# Patient Record
Sex: Female | Born: 1950 | Race: White | Hispanic: No | Smoking: Former smoker
Health system: Southern US, Community
[De-identification: ages and names within clinical notes are randomized; demographics above are authoritative.]

## PROBLEM LIST (undated history)

## (undated) DIAGNOSIS — F419 Anxiety disorder, unspecified: Secondary | ICD-10-CM

## (undated) DIAGNOSIS — E119 Type 2 diabetes mellitus without complications: Secondary | ICD-10-CM

## (undated) DIAGNOSIS — E785 Hyperlipidemia, unspecified: Secondary | ICD-10-CM

## (undated) DIAGNOSIS — F32A Depression, unspecified: Secondary | ICD-10-CM

## (undated) DIAGNOSIS — I2510 Atherosclerotic heart disease of native coronary artery without angina pectoris: Secondary | ICD-10-CM

## (undated) DIAGNOSIS — J449 Chronic obstructive pulmonary disease, unspecified: Secondary | ICD-10-CM

## (undated) DIAGNOSIS — I714 Abdominal aortic aneurysm, without rupture, unspecified: Secondary | ICD-10-CM

## (undated) DIAGNOSIS — I739 Peripheral vascular disease, unspecified: Secondary | ICD-10-CM

## (undated) DIAGNOSIS — I5022 Chronic systolic (congestive) heart failure: Secondary | ICD-10-CM

## (undated) DIAGNOSIS — Z972 Presence of dental prosthetic device (complete) (partial): Secondary | ICD-10-CM

## (undated) DIAGNOSIS — I480 Paroxysmal atrial fibrillation: Secondary | ICD-10-CM

## (undated) HISTORY — PX: CORONARY ARTERY BYPASS GRAFT: SHX141

## (undated) HISTORY — PX: CORONARY ANGIOPLASTY WITH STENT PLACEMENT: SHX49

## (undated) HISTORY — PX: CHOLECYSTECTOMY: SHX55

---

## 2003-07-30 HISTORY — PX: CORONARY ARTERY BYPASS GRAFT: SHX141

## 2004-05-30 ENCOUNTER — Ambulatory Visit: Payer: Self-pay | Admitting: Occupational Therapy

## 2005-04-16 ENCOUNTER — Ambulatory Visit (HOSPITAL_COMMUNITY): Admission: RE | Admit: 2005-04-16 | Discharge: 2005-04-16 | Payer: Self-pay | Admitting: Occupational Therapy

## 2009-10-02 ENCOUNTER — Emergency Department (HOSPITAL_COMMUNITY): Admission: EM | Admit: 2009-10-02 | Discharge: 2009-10-03 | Payer: Self-pay | Admitting: Emergency Medicine

## 2010-10-22 LAB — URINALYSIS, ROUTINE W REFLEX MICROSCOPIC
Bilirubin Urine: NEGATIVE
Nitrite: NEGATIVE
Protein, ur: NEGATIVE mg/dL
Specific Gravity, Urine: 1.015 (ref 1.005–1.030)
pH: 5.5 (ref 5.0–8.0)

## 2010-10-22 LAB — COMPREHENSIVE METABOLIC PANEL
ALT: 43 U/L — ABNORMAL HIGH (ref 0–35)
AST: 24 U/L (ref 0–37)
Albumin: 3.9 g/dL (ref 3.5–5.2)
Alkaline Phosphatase: 69 U/L (ref 39–117)
BUN: 22 mg/dL (ref 6–23)
Calcium: 9.7 mg/dL (ref 8.4–10.5)
Creatinine, Ser: 0.94 mg/dL (ref 0.4–1.2)
GFR calc non Af Amer: >60 mL/min (ref >60)
Glucose, Bld: 500 mg/dL — ABNORMAL HIGH (ref 70–99)
Total Protein: 6.6 g/dL (ref 6.0–8.3)

## 2010-10-22 LAB — POCT CARDIAC MARKERS
CKMB, poc: 3.4 ng/mL (ref 1.0–8.0)
Myoglobin, poc: 202 ng/mL (ref 12–200)
Troponin i, poc: <0.05 ng/mL (ref 0.00–0.09)

## 2010-10-22 LAB — CBC
HCT: 41.8 % (ref 36.0–46.0)
Hemoglobin: 14.5 g/dL (ref 12.0–15.0)
Platelets: 206 10*3/uL (ref 150–400)
RBC: 4.43 MIL/uL (ref 3.87–5.11)

## 2010-10-22 LAB — DIFFERENTIAL
Monocytes Absolute: 0.9 10*3/uL (ref 0.1–1.0)
Monocytes Relative: 4 % (ref 3–12)
Neutrophils Relative %: 91 % — ABNORMAL HIGH (ref 43–77)

## 2010-10-22 LAB — URINE MICROSCOPIC-ADD ON

## 2013-10-23 ENCOUNTER — Emergency Department (HOSPITAL_COMMUNITY): Payer: BC Managed Care – PPO

## 2013-10-23 ENCOUNTER — Encounter (HOSPITAL_COMMUNITY): Payer: Self-pay | Admitting: Emergency Medicine

## 2013-10-23 ENCOUNTER — Emergency Department (HOSPITAL_COMMUNITY)
Admission: EM | Admit: 2013-10-23 | Discharge: 2013-10-23 | Disposition: A | Discharge status: Home / Self Care | Payer: BC Managed Care – PPO | Attending: Emergency Medicine | Admitting: Emergency Medicine

## 2013-10-23 DIAGNOSIS — F17200 Nicotine dependence, unspecified, uncomplicated: Secondary | ICD-10-CM | POA: Insufficient documentation

## 2013-10-23 DIAGNOSIS — Z7982 Long term (current) use of aspirin: Secondary | ICD-10-CM | POA: Insufficient documentation

## 2013-10-23 DIAGNOSIS — R1013 Epigastric pain: Secondary | ICD-10-CM | POA: Insufficient documentation

## 2013-10-23 DIAGNOSIS — E860 Dehydration: Secondary | ICD-10-CM | POA: Insufficient documentation

## 2013-10-23 DIAGNOSIS — R112 Nausea with vomiting, unspecified: Secondary | ICD-10-CM | POA: Insufficient documentation

## 2013-10-23 DIAGNOSIS — I2510 Atherosclerotic heart disease of native coronary artery without angina pectoris: Secondary | ICD-10-CM | POA: Insufficient documentation

## 2013-10-23 DIAGNOSIS — Z951 Presence of aortocoronary bypass graft: Secondary | ICD-10-CM | POA: Insufficient documentation

## 2013-10-23 DIAGNOSIS — Z79899 Other long term (current) drug therapy: Secondary | ICD-10-CM | POA: Insufficient documentation

## 2013-10-23 DIAGNOSIS — E119 Type 2 diabetes mellitus without complications: Secondary | ICD-10-CM | POA: Insufficient documentation

## 2013-10-23 DIAGNOSIS — R197 Diarrhea, unspecified: Secondary | ICD-10-CM | POA: Insufficient documentation

## 2013-10-23 HISTORY — DX: Type 2 diabetes mellitus without complications: E11.9

## 2013-10-23 HISTORY — DX: Atherosclerotic heart disease of native coronary artery without angina pectoris: I25.10

## 2013-10-23 LAB — URINALYSIS, ROUTINE W REFLEX MICROSCOPIC
BILIRUBIN URINE: NEGATIVE
GLUCOSE, UA: NEGATIVE mg/dL
Hgb urine dipstick: NEGATIVE
KETONES UR: NEGATIVE mg/dL
Leukocytes, UA: NEGATIVE
NITRITE: NEGATIVE
PH: 6 (ref 5.0–8.0)
Protein, ur: NEGATIVE mg/dL
UROBILINOGEN UA: 0.2 mg/dL (ref 0.0–1.0)

## 2013-10-23 LAB — CBC WITH DIFFERENTIAL/PLATELET
Basophils Absolute: 0 10*3/uL (ref 0.0–0.1)
Basophils Relative: 0 % (ref 0–1)
EOS ABS: 0 10*3/uL (ref 0.0–0.7)
EOS PCT: 0 % (ref 0–5)
HCT: 44.1 % (ref 36.0–46.0)
HEMOGLOBIN: 14.9 g/dL (ref 12.0–15.0)
LYMPHS ABS: 0.8 10*3/uL (ref 0.7–4.0)
LYMPHS PCT: 14 % (ref 12–46)
MCH: 30.3 pg (ref 26.0–34.0)
MCHC: 33.8 g/dL (ref 30.0–36.0)
MCV: 89.8 fL (ref 78.0–100.0)
MONO ABS: 0.6 10*3/uL (ref 0.1–1.0)
MONOS PCT: 10 % (ref 3–12)
Neutro Abs: 4.3 10*3/uL (ref 1.7–7.7)
Neutrophils Relative %: 76 % (ref 43–77)
PLATELETS: 152 10*3/uL (ref 150–400)
RBC: 4.91 MIL/uL (ref 3.87–5.11)
RDW: 13.2 % (ref 11.5–15.5)
WBC: 5.7 10*3/uL (ref 4.0–10.5)

## 2013-10-23 LAB — COMPREHENSIVE METABOLIC PANEL
ALK PHOS: 40 U/L (ref 39–117)
ALT: 37 U/L — ABNORMAL HIGH (ref 0–35)
AST: 29 U/L (ref 0–37)
Albumin: 3.6 g/dL (ref 3.5–5.2)
BILIRUBIN TOTAL: 0.6 mg/dL (ref 0.3–1.2)
BUN: 12 mg/dL (ref 6–23)
CHLORIDE: 101 meq/L (ref 96–112)
CO2: 24 mEq/L (ref 19–32)
Calcium: 8.7 mg/dL (ref 8.4–10.5)
Creatinine, Ser: 0.57 mg/dL (ref 0.50–1.10)
GFR calc Af Amer: >90 mL/min (ref >90)
Glucose, Bld: 227 mg/dL — ABNORMAL HIGH (ref 70–99)
Potassium: 3.6 mEq/L — ABNORMAL LOW (ref 3.7–5.3)
SODIUM: 139 meq/L (ref 137–147)
Total Protein: 7.1 g/dL (ref 6.0–8.3)

## 2013-10-23 LAB — TROPONIN I: Troponin I: <0.3 ng/mL (ref <0.30)

## 2013-10-23 LAB — CBG MONITORING, ED: GLUCOSE-CAPILLARY: 204 mg/dL — AB (ref 70–99)

## 2013-10-23 MED ORDER — SODIUM CHLORIDE 0.9 % IV SOLN
1000.0000 mL | Freq: Once | INTRAVENOUS | Status: AC
  Administered 2013-10-23: 1000 mL via INTRAVENOUS

## 2013-10-23 MED ORDER — DIPHENHYDRAMINE HCL 50 MG/ML IJ SOLN
25.0000 mg | Freq: Once | INTRAMUSCULAR | Status: AC
  Administered 2013-10-23: 25 mg via INTRAVENOUS
  Filled 2013-10-23: qty 1

## 2013-10-23 MED ORDER — ONDANSETRON 4 MG PO TBDP: 4.0000 mg | ORAL_TABLET | Freq: Three times a day (TID) | ORAL | Status: DC | PRN

## 2013-10-23 MED ORDER — ONDANSETRON HCL 4 MG/2ML IJ SOLN
4.0000 mg | Freq: Once | INTRAMUSCULAR | Status: AC
  Administered 2013-10-23: 4 mg via INTRAVENOUS
  Filled 2013-10-23: qty 2

## 2013-10-23 MED ORDER — SODIUM CHLORIDE 0.9 % IV SOLN
1000.0000 mL | INTRAVENOUS | Status: DC
  Administered 2013-10-23: 1000 mL via INTRAVENOUS

## 2013-10-23 MED ORDER — METOCLOPRAMIDE HCL 5 MG/ML IJ SOLN
10.0000 mg | Freq: Once | INTRAMUSCULAR | Status: AC
  Administered 2013-10-23: 10 mg via INTRAVENOUS
  Filled 2013-10-23: qty 2

## 2013-10-23 NOTE — ED Notes (Signed)
Pt c/o n/v/d, generalized body aches, chills that started during the night,

## 2013-10-23 NOTE — Discharge Instructions (Signed)
Drink plenty of fluids. Take the zofran for nausea or vomiting. Take imodium OTC for diarrhea. Avoid milk until the diarrhea is gone. Recheck if you get worse again.  Nausea and Vomiting Nausea is a sick feeling that often comes before throwing up (vomiting). Vomiting is a reflex where stomach contents come out of your mouth. Vomiting can cause severe loss of body fluids (dehydration). Children and elderly adults can become dehydrated quickly, especially if they also have diarrhea. Nausea and vomiting are symptoms of a condition or disease. It is important to find the cause of your symptoms. CAUSES   Direct irritation of the stomach lining. This irritation can result from increased acid production (gastroesophageal reflux disease), infection, food poisoning, taking certain medicines (such as nonsteroidal anti-inflammatory drugs), alcohol use, or tobacco use.  Signals from the brain.These signals could be caused by a headache, heat exposure, an inner ear disturbance, increased pressure in the brain from injury, infection, a tumor, or a concussion, pain, emotional stimulus, or metabolic problems.  An obstruction in the gastrointestinal tract (bowel obstruction).  Illnesses such as diabetes, hepatitis, gallbladder problems, appendicitis, kidney problems, cancer, sepsis, atypical symptoms of a heart attack, or eating disorders.  Medical treatments such as chemotherapy and radiation.  Receiving medicine that makes you sleep (general anesthetic) during surgery. DIAGNOSIS Your caregiver may ask for tests to be done if the problems do not improve after a few days. Tests may also be done if symptoms are severe or if the reason for the nausea and vomiting is not clear. Tests may include:  Urine tests.  Blood tests.  Stool tests.  Cultures (to look for evidence of infection).  X-rays or other imaging studies. Test results can help your caregiver make decisions about treatment or the need for  additional tests. TREATMENT You need to stay well hydrated. Drink frequently but in small amounts.You may wish to drink water, sports drinks, clear broth, or eat frozen ice pops or gelatin dessert to help stay hydrated.When you eat, eating slowly may help prevent nausea.There are also some antinausea medicines that may help prevent nausea. HOME CARE INSTRUCTIONS   Take all medicine as directed by your caregiver.  If you do not have an appetite, do not force yourself to eat. However, you must continue to drink fluids.  If you have an appetite, eat a normal diet unless your caregiver tells you differently.  Eat a variety of complex carbohydrates (rice, wheat, potatoes, bread), lean meats, yogurt, fruits, and vegetables.  Avoid high-fat foods because they are more difficult to digest.  Drink enough water and fluids to keep your urine clear or pale yellow.  If you are dehydrated, ask your caregiver for specific rehydration instructions. Signs of dehydration may include:  Severe thirst.  Dry lips and mouth.  Dizziness.  Dark urine.  Decreasing urine frequency and amount.  Confusion.  Rapid breathing or pulse. SEEK IMMEDIATE MEDICAL CARE IF:   You have blood or brown flecks (like coffee grounds) in your vomit.  You have black or bloody stools.  You have a severe headache or stiff neck.  You are confused.  You have severe abdominal pain.  You have chest pain or trouble breathing.  You do not urinate at least once every 8 hours.  You develop cold or clammy skin.  You continue to vomit for longer than 24 to 48 hours.  You have a fever. MAKE SURE YOU:   Understand these instructions.  Will watch your condition.  Will get help right  away if you are not doing well or get worse. Document Released: 07/15/2005 Document Revised: 10/07/2011 Document Reviewed: 12/12/2010 [facility] Patient Information 2014 [place], [place].

## 2013-10-23 NOTE — ED Provider Notes (Signed)
CSN: [id number]     Arrival date & time 10/23/13  1811 History   First MD Initiated Contact with Patient 10/23/13 1823     Chief Complaint  Patient presents with  . Emesis     (Consider location/radiation/quality/duration/timing/severity/associated sxs/prior Treatment) HPI Patient reports she ate a salad that she bought from a restaurant that consisted of typical salad items such as lettuce. She states about 11:30 she started having epigastric discomfort that she describes as "full" and aching. She started having nausea and had about 4 prolonged episodes of vomiting. She states she had mild diarrhea described as loose and watery. She denies any fever. She also states she's been having body aches and chills and started having a dry cough yesterday. She denies being around anybody else is ill. She did have a flu shot this season.  PCP Dr Merlyn Albert  Past Medical History  Diagnosis Date  . Diabetes mellitus without complication   . Coronary artery disease    Past Surgical History  Procedure Laterality Date  . Coronary artery bypass graft     No family history on file. History  Substance Use Topics  . Smoking status: Current Every Day Smoker  . Smokeless tobacco: Not on file  . 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      Allergies  Codeine; Hydrocodone; and Lipitor  Home Medications   Current Outpatient Rx  Name  Route  Sig  Dispense  Refill  . aspirin EC 81 MG tablet   Oral   Take 81 mg by mouth daily.         . cetirizine (ZYRTEC) 10 MG tablet   Oral   Take 10 mg by mouth daily.         . Cholecalciferol (VITAMIN D) 2000 UNITS tablet   Oral   Take 2,000 Units by mouth once a week.         . enalapril (VASOTEC) 10 MG tablet   Oral   Take 10 mg by mouth daily.         . metFORMIN  (GLUCOPHAGE) 1000 MG tablet   Oral   Take 1,000 mg by mouth 2 (two) times daily.         . metoprolol tartrate (LOPRESSOR) 25 MG tablet   Oral   Take 25 mg by mouth 2 (two) times daily.         . simvastatin (ZOCOR) 40 MG tablet   Oral   Take 40 mg by mouth at bedtime.         . sitaGLIPtin (JANUVIA) 100 MG tablet   Oral   Take 100 mg by mouth daily.         Marland Kitchen zolpidem (AMBIEN) 10 MG tablet   Oral   Take 10 mg by mouth at bedtime.          BP 104/84  Pulse 94  Temp(Src) 97.9 F (36.6 C) (Oral)  Resp 20  Ht 5\' 2"  (1.575 m)  Wt 133 lb (60.328 kg)  BMI 24.32 kg/m2  SpO2 98%  Vital signs normal   Physical Exam  Nursing note and vitals reviewed. Constitutional: She is oriented to person, place, and time. She appears well-developed and well-nourished.  Non-toxic  appearance. She does not appear ill. No distress.  HENT:  Head: Normocephalic and atraumatic.  Right Ear: External ear normal.  Left Ear: External ear normal.  Nose: Nose normal. No mucosal edema or rhinorrhea.  Mouth/Throat: Oropharynx is clear and moist and mucous membranes are normal. No dental abscesses or uvula swelling.  Eyes: Conjunctivae and EOM are normal. Pupils are equal, round, and reactive to light.  Neck: Normal range of motion and full passive range of motion without pain. Neck supple.  Cardiovascular: Normal rate, regular rhythm and normal heart sounds.  Exam reveals no gallop and no friction rub.   No murmur heard. Pulmonary/Chest: Effort normal and breath sounds normal. No respiratory distress. She has no wheezes. She has no rhonchi. She has no rales. She exhibits no tenderness and no crepitus.  Abdominal: Soft. Normal appearance and bowel sounds are normal. She exhibits no distension. There is tenderness. There is no rebound and no guarding.    Musculoskeletal: Normal range of motion. She exhibits no edema and no tenderness.  Moves all extremities well.   Neurological: She is alert  and oriented to person, place, and time. She has normal strength. No cranial nerve deficit.  Skin: Skin is warm, dry and intact. No rash noted. No erythema. No pallor.  Psychiatric: She has a normal mood and affect. Her speech is normal and behavior is normal. Her mood appears not anxious.    ED Course  Procedures (including critical care time)  Medications  0.9 %  sodium chloride infusion (0 mLs Intravenous Stopped 10/23/13 2050)    Followed by  0.9 %  sodium chloride infusion (1,000 mLs Intravenous New Bag/Given 10/23/13 2111)  ondansetron (ZOFRAN) injection 4 mg (4 mg Intravenous Given 10/23/13 1953)  metoCLOPramide (REGLAN) injection 10 mg (10 mg Intravenous Given 10/23/13 1949)  diphenhydrAMINE (BENADRYL) injection 25 mg (25 mg Intravenous Given 10/23/13 1951)   Patient has only had diabetes for 3 years. I doubt if she is having gastroparesis. She'll be treated for viral illness and treat her dehydration. Chest x-ray was done to make sure she did not have a pneumonia with her complaints of chills and dry cough.  Recheck at 2100. Patient states her chest pain is gone. Her nausea is improved. She reports only a small amount urinary output. She was given a second liter of IV fluid. She was given the results of her tests and x-rays. She should be able to be discharged. She has not started on antiviral/flu medication because she has already had the flu vaccine.  Labs Review Results for orders placed during the hospital encounter of 10/23/13  CBC WITH DIFFERENTIAL      Result Value Ref Range   WBC 5.7  4.0 - 10.5 K/uL   RBC 4.91  3.87 - 5.11 MIL/uL   Hemoglobin 14.9  12.0 - 15.0 g/dL   HCT 44.1  36.0 - 46.0 %   MCV 89.8  78.0 - 100.0 fL   MCH 30.3  26.0 - 34.0 pg   MCHC 33.8  30.0 - 36.0 g/dL   RDW 13.2  11.5 - 15.5 %   Platelets 152  150 - 400 K/uL   Neutrophils Relative % 76  43 - 77 %   Neutro Abs 4.3  1.7 - 7.7 K/uL   Lymphocytes Relative 14  12 - 46 %   Lymphs Abs 0.8  0.7 - 4.0  K/uL   Monocytes Relative 10  3 - 12 %   Monocytes Absolute 0.6  0.1 -  1.0 K/uL   Eosinophils Relative 0  0 - 5 %   Eosinophils Absolute 0.0  0.0 - 0.7 K/uL   Basophils Relative 0  0 - 1 %   Basophils Absolute 0.0  0.0 - 0.1 K/uL  COMPREHENSIVE METABOLIC PANEL      Result Value Ref Range   Sodium 139  137 - 147 mEq/L   Potassium 3.6 (*) 3.7 - 5.3 mEq/L   Chloride 101  96 - 112 mEq/L   CO2 24  19 - 32 mEq/L   Glucose, Bld 227 (*) 70 - 99 mg/dL   BUN 12  6 - 23 mg/dL   Creatinine, Ser 0.57  0.50 - 1.10 mg/dL   Calcium 8.7  8.4 - 10.5 mg/dL   Total Protein 7.1  6.0 - 8.3 g/dL   Albumin 3.6  3.5 - 5.2 g/dL   AST 29  0 - 37 U/L   ALT 37 (*) 0 - 35 U/L   Alkaline Phosphatase 40  39 - 117 U/L   Total Bilirubin 0.6  0.3 - 1.2 mg/dL   GFR calc non Af Amer >90  >90 mL/min   GFR calc Af Amer >90  >90 mL/min  URINALYSIS, ROUTINE W REFLEX MICROSCOPIC      Result Value Ref Range   Color, Urine YELLOW  YELLOW   APPearance CLEAR  CLEAR   Specific Gravity, Urine <1.005 (*) 1.005 - 1.030   pH 6.0  5.0 - 8.0   Glucose, UA NEGATIVE  NEGATIVE mg/dL   Hgb urine dipstick NEGATIVE  NEGATIVE   Bilirubin Urine NEGATIVE  NEGATIVE   Ketones, ur NEGATIVE  NEGATIVE mg/dL   Protein, ur NEGATIVE  NEGATIVE mg/dL   Urobilinogen, UA 0.2  0.0 - 1.0 mg/dL   Nitrite NEGATIVE  NEGATIVE   Leukocytes, UA NEGATIVE  NEGATIVE  TROPONIN I      Result Value Ref Range   Troponin I <0.30  <0.30 ng/mL  CBG MONITORING, ED      Result Value Ref Range   Glucose-Capillary 204 (*) 70 - 99 mg/dL   Laboratory interpretation all normal except hyperglycemia    Imaging Review Dg Chest 2 View  10/23/2013   CLINICAL DATA:  Generalized body aches.  Chills.  EXAM: CHEST  2 VIEW  COMPARISON:  10/02/2009  FINDINGS: Changes from cardiac surgery are stable. Cardiac silhouette is mildly enlarged. Normal mediastinal and hilar contours.  Clear lungs.  No pleural effusion or pneumothorax.  Bony thorax is demineralized but intact.   IMPRESSION: No acute cardiopulmonary disease.   Electronically Signed   By: Lajean Manes M.D.   On: 10/23/2013 20:41     EKG Interpretation   Date/Time:  Saturday October 23 2013 19:53:17 EDT Ventricular Rate:  88 PR Interval:  172 QRS Duration: 84 QT Interval:  346 QTC Calculation: 418 R Axis:   80 Text Interpretation:  Normal sinus rhythm Left ventricular hypertrophy  with repolarization abnormality Anteroseptal infarct (cited on or before  02-Oct-2009) When compared with ECG of 02-Oct-2009 22:34, Vent. rate has  increased BY  30 BPM Serial changes of Anteroseptal infarct Present  Confirmed by Marlissa Emerick  MD-I, Zaniya Mcaulay ([id number]) on 10/23/2013 8:00:11 PM      MDM   Final diagnoses:  Nausea vomiting and diarrhea  Dehydration    New Prescriptions   ONDANSETRON (ZOFRAN ODT) 4 MG DISINTEGRATING TABLET    Take 1 tablet (4 mg total) by mouth every 8 (eight) hours as needed for nausea or vomiting.  Plan discharge   Rolland Porter, MD, Alanson Aly, MD 10/23/13 2202

## 2014-01-26 DEATH — deceased

## 2014-04-15 ENCOUNTER — Other Ambulatory Visit (HOSPITAL_COMMUNITY): Payer: Self-pay | Admitting: Internal Medicine

## 2014-04-15 DIAGNOSIS — R748 Abnormal levels of other serum enzymes: Secondary | ICD-10-CM

## 2014-04-21 ENCOUNTER — Ambulatory Visit (HOSPITAL_COMMUNITY)
Admission: RE | Admit: 2014-04-21 | Discharge: 2014-04-21 | Disposition: A | Discharge status: Home / Self Care | Payer: BC Managed Care – PPO | Source: Ambulatory Visit | Attending: Internal Medicine | Admitting: Internal Medicine

## 2014-04-21 DIAGNOSIS — Z87898 Personal history of other specified conditions: Secondary | ICD-10-CM | POA: Diagnosis not present

## 2014-04-21 DIAGNOSIS — R945 Abnormal results of liver function studies: Secondary | ICD-10-CM | POA: Diagnosis present

## 2014-04-21 DIAGNOSIS — Q447 Other congenital malformations of liver: Secondary | ICD-10-CM

## 2014-04-21 DIAGNOSIS — R160 Hepatomegaly, not elsewhere classified: Secondary | ICD-10-CM | POA: Diagnosis not present

## 2014-04-21 DIAGNOSIS — Q445 Other congenital malformations of bile ducts: Secondary | ICD-10-CM

## 2014-04-21 DIAGNOSIS — Z9089 Acquired absence of other organs: Secondary | ICD-10-CM | POA: Diagnosis not present

## 2014-04-21 DIAGNOSIS — Q4479 Other congenital malformations of liver: Secondary | ICD-10-CM | POA: Insufficient documentation

## 2014-04-21 DIAGNOSIS — Q441 Other congenital malformations of gallbladder: Secondary | ICD-10-CM | POA: Insufficient documentation

## 2014-04-21 DIAGNOSIS — R748 Abnormal levels of other serum enzymes: Secondary | ICD-10-CM

## 2014-05-03 ENCOUNTER — Encounter (INDEPENDENT_AMBULATORY_CARE_PROVIDER_SITE_OTHER): Payer: Self-pay | Admitting: *Deleted

## 2014-05-26 ENCOUNTER — Ambulatory Visit (INDEPENDENT_AMBULATORY_CARE_PROVIDER_SITE_OTHER): Payer: BC Managed Care – PPO | Admitting: Internal Medicine

## 2015-05-05 ENCOUNTER — Encounter (INDEPENDENT_AMBULATORY_CARE_PROVIDER_SITE_OTHER): Payer: Self-pay | Admitting: *Deleted

## 2015-07-31 DIAGNOSIS — J209 Acute bronchitis, unspecified: Secondary | ICD-10-CM | POA: Diagnosis not present

## 2015-08-09 DIAGNOSIS — M7582 Other shoulder lesions, left shoulder: Secondary | ICD-10-CM | POA: Diagnosis not present

## 2015-08-21 DIAGNOSIS — I714 Abdominal aortic aneurysm, without rupture: Secondary | ICD-10-CM | POA: Diagnosis not present

## 2015-08-21 DIAGNOSIS — Z720 Tobacco use: Secondary | ICD-10-CM | POA: Diagnosis not present

## 2015-08-21 DIAGNOSIS — M25512 Pain in left shoulder: Secondary | ICD-10-CM | POA: Diagnosis not present

## 2015-08-21 DIAGNOSIS — R05 Cough: Secondary | ICD-10-CM | POA: Diagnosis not present

## 2015-09-27 DIAGNOSIS — R6889 Other general symptoms and signs: Secondary | ICD-10-CM | POA: Diagnosis not present

## 2015-10-03 DIAGNOSIS — R7301 Impaired fasting glucose: Secondary | ICD-10-CM | POA: Diagnosis not present

## 2015-10-03 DIAGNOSIS — E782 Mixed hyperlipidemia: Secondary | ICD-10-CM | POA: Diagnosis not present

## 2015-10-03 DIAGNOSIS — R945 Abnormal results of liver function studies: Secondary | ICD-10-CM | POA: Diagnosis not present

## 2015-10-03 DIAGNOSIS — E779 Disorder of glycoprotein metabolism, unspecified: Secondary | ICD-10-CM | POA: Diagnosis not present

## 2015-10-25 DIAGNOSIS — E119 Type 2 diabetes mellitus without complications: Secondary | ICD-10-CM | POA: Diagnosis not present

## 2015-10-25 DIAGNOSIS — I714 Abdominal aortic aneurysm, without rupture: Secondary | ICD-10-CM | POA: Diagnosis not present

## 2015-10-25 DIAGNOSIS — I2510 Atherosclerotic heart disease of native coronary artery without angina pectoris: Secondary | ICD-10-CM | POA: Diagnosis not present

## 2015-10-25 DIAGNOSIS — I10 Essential (primary) hypertension: Secondary | ICD-10-CM | POA: Diagnosis not present

## 2015-10-31 ENCOUNTER — Encounter (INDEPENDENT_AMBULATORY_CARE_PROVIDER_SITE_OTHER): Payer: Self-pay | Admitting: *Deleted

## 2015-11-20 DIAGNOSIS — E119 Type 2 diabetes mellitus without complications: Secondary | ICD-10-CM | POA: Diagnosis not present

## 2015-11-22 ENCOUNTER — Other Ambulatory Visit (INDEPENDENT_AMBULATORY_CARE_PROVIDER_SITE_OTHER): Payer: Self-pay | Admitting: *Deleted

## 2015-11-22 ENCOUNTER — Encounter (INDEPENDENT_AMBULATORY_CARE_PROVIDER_SITE_OTHER): Payer: Self-pay | Admitting: *Deleted

## 2015-11-22 DIAGNOSIS — Z1211 Encounter for screening for malignant neoplasm of colon: Secondary | ICD-10-CM

## 2016-01-29 ENCOUNTER — Encounter (INDEPENDENT_AMBULATORY_CARE_PROVIDER_SITE_OTHER): Payer: Self-pay | Admitting: *Deleted

## 2016-01-29 ENCOUNTER — Other Ambulatory Visit (INDEPENDENT_AMBULATORY_CARE_PROVIDER_SITE_OTHER): Payer: Self-pay | Admitting: *Deleted

## 2016-01-29 NOTE — Telephone Encounter (Signed)
Patient needs trilyte 

## 2016-02-01 MED ORDER — PEG 3350-KCL-NA BICARB-NACL 420 G PO SOLR: 4000.0000 mL | Freq: Once | ORAL | Status: DC

## 2016-02-14 ENCOUNTER — Telehealth (INDEPENDENT_AMBULATORY_CARE_PROVIDER_SITE_OTHER): Payer: Self-pay | Admitting: *Deleted

## 2016-02-14 NOTE — Telephone Encounter (Signed)
agree

## 2016-02-14 NOTE — Telephone Encounter (Signed)
Referring MD/PCP: hall   Procedure: tcs  Reason/Indication:  screening  Has patient had this procedure before?  Yes, 15-16 yrs ago  If so, when, by whom and where?    Is there a family history of colon cancer?  no  Who?  What age when diagnosed?    Is patient diabetic?   yes      Does patient have prosthetic heart valve or mechanical valve?  no  Do you have a pacemaker?  no  Has patient ever had endocarditis? no  Has patient had joint replacement within last 12 months?  no  Does patient tend to be constipated or take laxatives? no  Does patient have a history of alcohol/drug use?  no  Is patient on Coumadin, Plavix and/or Aspirin? yes  Medications: asa 81 mg daily, calcium 600 mg daily, zocor 40 mg daily, metformin 1000 mg bid, metoprolol 25 mg daily, enalapril 10 mg bid, multi vit daily, zyrtec 10 mg daily, glimepiride 1 mg daily  Allergies: see epic  Medication Adjustment: asa 2 days, hold metformin evening before & morning of, hold glimepiride morning of  Procedure date & time: 03/06/16 at 930

## 2016-02-20 DIAGNOSIS — E119 Type 2 diabetes mellitus without complications: Secondary | ICD-10-CM | POA: Diagnosis not present

## 2016-02-20 DIAGNOSIS — E782 Mixed hyperlipidemia: Secondary | ICD-10-CM | POA: Diagnosis not present

## 2016-02-22 DIAGNOSIS — I2510 Atherosclerotic heart disease of native coronary artery without angina pectoris: Secondary | ICD-10-CM | POA: Diagnosis not present

## 2016-02-22 DIAGNOSIS — E782 Mixed hyperlipidemia: Secondary | ICD-10-CM | POA: Diagnosis not present

## 2016-02-22 DIAGNOSIS — I714 Abdominal aortic aneurysm, without rupture: Secondary | ICD-10-CM | POA: Diagnosis not present

## 2016-02-22 DIAGNOSIS — R1084 Generalized abdominal pain: Secondary | ICD-10-CM | POA: Diagnosis not present

## 2016-02-22 DIAGNOSIS — R945 Abnormal results of liver function studies: Secondary | ICD-10-CM | POA: Diagnosis not present

## 2016-02-22 DIAGNOSIS — E119 Type 2 diabetes mellitus without complications: Secondary | ICD-10-CM | POA: Diagnosis not present

## 2016-02-22 DIAGNOSIS — R21 Rash and other nonspecific skin eruption: Secondary | ICD-10-CM | POA: Diagnosis not present

## 2016-02-22 DIAGNOSIS — R69 Illness, unspecified: Secondary | ICD-10-CM | POA: Diagnosis not present

## 2016-02-27 ENCOUNTER — Telehealth (INDEPENDENT_AMBULATORY_CARE_PROVIDER_SITE_OTHER): Payer: Self-pay | Admitting: *Deleted

## 2016-02-27 ENCOUNTER — Encounter (INDEPENDENT_AMBULATORY_CARE_PROVIDER_SITE_OTHER): Payer: Self-pay | Admitting: *Deleted

## 2016-02-27 NOTE — Telephone Encounter (Signed)
Patient states she cannot drink the gallon prep.  We asked Dr. Laural Golden since Diabetic could she get sample of Suprep  She does not have kidney disease per patient.  She will pick up prep.

## 2016-03-04 ENCOUNTER — Other Ambulatory Visit (INDEPENDENT_AMBULATORY_CARE_PROVIDER_SITE_OTHER): Payer: Self-pay | Admitting: *Deleted

## 2016-03-04 DIAGNOSIS — Z1211 Encounter for screening for malignant neoplasm of colon: Secondary | ICD-10-CM

## 2016-03-06 ENCOUNTER — Encounter (HOSPITAL_COMMUNITY)
Admission: RE | Disposition: A | Discharge status: Home / Self Care | Payer: Self-pay | Source: Ambulatory Visit | Attending: Internal Medicine

## 2016-03-06 ENCOUNTER — Ambulatory Visit (HOSPITAL_COMMUNITY)
Admission: RE | Admit: 2016-03-06 | Discharge: 2016-03-06 | Disposition: A | Discharge status: Home / Self Care | Payer: Medicare HMO | Source: Ambulatory Visit | Attending: Internal Medicine | Admitting: Internal Medicine

## 2016-03-06 ENCOUNTER — Encounter (HOSPITAL_COMMUNITY): Payer: Self-pay | Admitting: *Deleted

## 2016-03-06 ENCOUNTER — Encounter (HOSPITAL_COMMUNITY): Payer: Self-pay

## 2016-03-06 DIAGNOSIS — K644 Residual hemorrhoidal skin tags: Secondary | ICD-10-CM | POA: Insufficient documentation

## 2016-03-06 DIAGNOSIS — E119 Type 2 diabetes mellitus without complications: Secondary | ICD-10-CM | POA: Diagnosis not present

## 2016-03-06 DIAGNOSIS — I2510 Atherosclerotic heart disease of native coronary artery without angina pectoris: Secondary | ICD-10-CM | POA: Diagnosis not present

## 2016-03-06 DIAGNOSIS — F17200 Nicotine dependence, unspecified, uncomplicated: Secondary | ICD-10-CM | POA: Diagnosis not present

## 2016-03-06 DIAGNOSIS — Z955 Presence of coronary angioplasty implant and graft: Secondary | ICD-10-CM | POA: Diagnosis not present

## 2016-03-06 DIAGNOSIS — Z1211 Encounter for screening for malignant neoplasm of colon: Secondary | ICD-10-CM

## 2016-03-06 DIAGNOSIS — Z79899 Other long term (current) drug therapy: Secondary | ICD-10-CM | POA: Diagnosis not present

## 2016-03-06 DIAGNOSIS — Z7982 Long term (current) use of aspirin: Secondary | ICD-10-CM | POA: Insufficient documentation

## 2016-03-06 DIAGNOSIS — Z7984 Long term (current) use of oral hypoglycemic drugs: Secondary | ICD-10-CM | POA: Insufficient documentation

## 2016-03-06 DIAGNOSIS — R69 Illness, unspecified: Secondary | ICD-10-CM | POA: Diagnosis not present

## 2016-03-06 HISTORY — PX: COLONOSCOPY: SHX5424

## 2016-03-06 LAB — GLUCOSE, CAPILLARY: Glucose-Capillary: 180 mg/dL — ABNORMAL HIGH (ref 65–99)

## 2016-03-06 SURGERY — COLONOSCOPY
Anesthesia: Moderate Sedation

## 2016-03-06 MED ORDER — MEPERIDINE HCL 50 MG/ML IJ SOLN
INTRAMUSCULAR | Status: AC
  Filled 2016-03-06: qty 1

## 2016-03-06 MED ORDER — STERILE WATER FOR IRRIGATION IR SOLN
Status: DC | PRN
  Administered 2016-03-06: 2.5 mL

## 2016-03-06 MED ORDER — SODIUM CHLORIDE 0.9 % IV SOLN
INTRAVENOUS | Status: DC
Start: 2016-03-06 — End: 2016-03-06

## 2016-03-06 MED ORDER — MIDAZOLAM HCL 5 MG/5ML IJ SOLN
INTRAMUSCULAR | Status: DC | PRN
  Administered 2016-03-06: 2 mg via INTRAVENOUS
  Administered 2016-03-06: 1 mg via INTRAVENOUS
  Administered 2016-03-06: 2 mg via INTRAVENOUS
  Administered 2016-03-06: 1 mg via INTRAVENOUS

## 2016-03-06 MED ORDER — SODIUM CHLORIDE 0.9 % IV SOLN
INTRAVENOUS | Status: DC
  Administered 2016-03-06: 1000 mL via INTRAVENOUS

## 2016-03-06 MED ORDER — MIDAZOLAM HCL 5 MG/5ML IJ SOLN
INTRAMUSCULAR | Status: AC
  Filled 2016-03-06: qty 10

## 2016-03-06 MED ORDER — MEPERIDINE HCL 50 MG/ML IJ SOLN
INTRAMUSCULAR | Status: DC | PRN
  Administered 2016-03-06 (×2): 25 mg via INTRAVENOUS

## 2016-03-06 NOTE — H&P (Signed)
Alicia Ortega is an 65 y.o. female.   Chief Complaint: Patient is here for colonoscopy. HPI: Patient is 65 year old Caucasian female who is here for screening colonoscopy. She denies abdominal pain change in bowel habits or rectal bleeding. Last colonoscopy was several years ago. Family History is negative for CRC.  Past Medical History:  Diagnosis Date  . Coronary artery disease   . Diabetes mellitus without complication Alicia Ortega)     Past Surgical History:  Procedure Laterality Date  . Alicia Ortega  . CHOLECYSTECTOMY    . CORONARY ARTERY BYPASS GRAFT      History reviewed. No pertinent family history. Social History:  reports that she has been smoking.  She has never used smokeless tobacco. She reports that she drinks alcohol. She reports that she does not use drugs.  Allergies:  Allergies  Allergen Reactions  . Codeine   . Hydrocodone   . Lipitor [Atorvastatin]     Medications Prior to Admission  Medication Sig Dispense Refill  . cetirizine (ZYRTEC) 10 MG tablet Take 10 mg by mouth daily.    . enalapril (VASOTEC) 10 MG tablet Take 10 mg by mouth daily.    . metFORMIN (GLUCOPHAGE) 1000 MG tablet Take 1,000 mg by mouth 2 (two) times daily.    . metoprolol tartrate (LOPRESSOR) 25 MG tablet Take 25 mg by mouth 2 (two) times daily.    . simvastatin (ZOCOR) 40 MG tablet Take 40 mg by mouth at bedtime.    Manus Gunning BOWEL PREP KIT 17.5-3.13-1.6 GM/180ML SOLN Take by mouth.    . zolpidem (AMBIEN) 10 MG tablet Take 10 mg by mouth at bedtime.    Marland Kitchen aspirin EC 81 MG tablet Take 81 mg by mouth daily.    . Cholecalciferol (VITAMIN D) 2000 UNITS tablet Take 2,000 Units by mouth once a week.    . ondansetron (ZOFRAN ODT) 4 MG disintegrating tablet Take 1 tablet (4 mg total) by mouth every 8 (eight) hours as needed for nausea or vomiting. 6 tablet 0  . polyethylene glycol-electrolytes (TRILYTE) 420 g solution Take 4,000 mLs by mouth once. 4000 mL 0  . sitaGLIPtin  (JANUVIA) 100 MG tablet Take 100 mg by mouth daily.      Results for orders placed or performed during the hospital encounter of 03/06/16 (from the past 48 hour(s))  Glucose, capillary     Status: Abnormal   Collection Time: 03/06/16  8:46 AM  Result Value Ref Range   Glucose-Capillary 180 (H) 65 - 99 mg/dL   No results found.  ROS  Blood pressure (!) 156/53, pulse 63, temperature 97.7 F (36.5 C), temperature source Oral, resp. rate 12, height '5\' 2"'$  (1.575 m), weight 126 lb (57.2 kg), SpO2 100 %. Physical Exam  Constitutional: She appears well-developed and well-nourished.  HENT:  Mouth/Throat: Oropharynx is clear and moist.  Eyes: Conjunctivae are normal. No scleral icterus.  Neck: No thyromegaly present.  Cardiovascular: Normal rate and regular rhythm.   Murmur (grade 2/6 systol best heard at LLSB) heard. Respiratory: Effort normal and breath sounds normal.  GI: Soft. She exhibits no distension and no mass. There is no tenderness.  Musculoskeletal: She exhibits no edema.  Lymphadenopathy:    She has no cervical adenopathy.  Neurological: She is alert.  Skin: Skin is warm and dry.     Assessment/Plan Average risk screening colonoscopy.  Alicia Laser, MD 03/06/2016, 9:11 AM

## 2016-03-06 NOTE — Discharge Instructions (Signed)
Resume usual medications including aspirin at usual dose. Resume usual diet. No driving for 24 hours. Next screening exam in 10 years.   Colonoscopy, Care After These instructions give you information on caring for yourself after your procedure. Your doctor may also give you more specific instructions. Call your doctor if you have any problems or questions after your procedure. HOME CARE  Do not drive for 24 hours.  Do not sign important papers or use machinery for 24 hours.  You may shower.  You may go back to your usual activities, but go slower for the first 24 hours.  Take rest breaks often during the first 24 hours.  Walk around or use warm packs on your belly (abdomen) if you have belly cramping or gas.  Drink enough fluids to keep your pee (urine) clear or pale yellow.  Resume your normal diet. Avoid heavy or fried foods.  Avoid drinking alcohol for 24 hours or as told by your doctor.  Only take medicines as told by your doctor. If a tissue sample (biopsy) was taken during the procedure:   Do not take aspirin or blood thinners for 7 days, or as told by your doctor.  Do not drink alcohol for 7 days, or as told by your doctor.  Eat soft foods for the first 24 hours. GET HELP IF: You still have a small amount of blood in your poop (stool) 2-3 days after the procedure. GET HELP RIGHT AWAY IF:  You have more than a small amount of blood in your poop.  You see clumps of tissue (blood clots) in your poop.  Your belly is puffy (swollen).  You feel sick to your stomach (nauseous) or throw up (vomit).  You have a fever.  You have belly pain that gets worse and medicine does not help. MAKE SURE YOU:  Understand these instructions.  Will watch your condition.  Will get help right away if you are not doing well or get worse.   This information is not intended to replace advice given to you by your health care provider. Make sure you discuss any questions you have  with your health care provider.   Document Released: 08/17/2010 Document Revised: 07/20/2013 Document Reviewed: 03/22/2013 Elsevier Interactive Patient Education 2016 [employer].   Hemorrhoids Hemorrhoids are swollen veins around the rectum or anus. There are two types of hemorrhoids:   Internal hemorrhoids. These occur in the veins just inside the rectum. They may poke through to the outside and become irritated and painful.  External hemorrhoids. These occur in the veins outside the anus and can be felt as a painful swelling or hard lump near the anus. CAUSES  Pregnancy.   Obesity.   Constipation or diarrhea.   Straining to have a bowel movement.   Sitting for long periods on the toilet.  Heavy lifting or other activity that caused you to strain.  Anal intercourse. SYMPTOMS   Pain.   Anal itching or irritation.   Rectal bleeding.   Fecal leakage.   Anal swelling.   One or more lumps around the anus.  DIAGNOSIS  Your caregiver may be able to diagnose hemorrhoids by visual examination. Other examinations or tests that may be performed include:   Examination of the rectal area with a gloved hand (digital rectal exam).   Examination of anal canal using a small tube (scope).   A blood test if you have lost a significant amount of blood.  A test to look inside the colon (  sigmoidoscopy or colonoscopy). TREATMENT Most hemorrhoids can be treated at home. However, if symptoms do not seem to be getting better or if you have a lot of rectal bleeding, your caregiver may perform a procedure to help make the hemorrhoids get smaller or remove them completely. Possible treatments include:   Placing a rubber band at the base of the hemorrhoid to cut off the circulation (rubber band ligation).   Injecting a chemical to shrink the hemorrhoid (sclerotherapy).   Using a tool to burn the hemorrhoid (infrared light therapy).   Surgically removing the hemorrhoid  (hemorrhoidectomy).   Stapling the hemorrhoid to block blood flow to the tissue (hemorrhoid stapling).  HOME CARE INSTRUCTIONS   Eat foods with fiber, such as whole grains, beans, nuts, fruits, and vegetables. Ask your doctor about taking products with added fiber in them (fibersupplements).  Increase fluid intake. Drink enough water and fluids to keep your urine clear or pale yellow.   Exercise regularly.   Go to the bathroom when you have the urge to have a bowel movement. Do not wait.   Avoid straining to have bowel movements.   Keep the anal area dry and clean. Use wet toilet paper or moist towelettes after a bowel movement.   Medicated creams and suppositories may be used or applied as directed.   Only take over-the-counter or prescription medicines as directed by your caregiver.   Take warm sitz baths for 15-20 minutes, 3-4 times a day to ease pain and discomfort.   Place ice packs on the hemorrhoids if they are tender and swollen. Using ice packs between sitz baths may be helpful.   Put ice in a plastic bag.   Place a towel between your skin and the bag.   Leave the ice on for 15-20 minutes, 3-4 times a day.   Do not use a donut-shaped pillow or sit on the toilet for long periods. This increases blood pooling and pain.  SEEK MEDICAL CARE IF:  You have increasing pain and swelling that is not controlled by treatment or medicine.  You have uncontrolled bleeding.  You have difficulty or you are unable to have a bowel movement.  You have pain or inflammation outside the area of the hemorrhoids. MAKE SURE YOU:  Understand these instructions.  Will watch your condition.  Will get help right away if you are not doing well or get worse.   This information is not intended to replace advice given to you by your health care provider. Make sure you discuss any questions you have with your health care provider.   Document Released: 07/12/2000 Document  Revised: 07/01/2012 Document Reviewed: 05/19/2012 Elsevier Interactive Patient Education [employer].

## 2016-03-06 NOTE — Op Note (Signed)
[facility] Patient Name: Alicia Ortega Procedure Date: 03/06/2016 9:04 AM MRN: [id number] Date of Birth: Dec 27, 1950 Attending MD: Hildred Laser , MD CSN: [id number] Age: 65 Admit Type: Outpatient Procedure:                Colonoscopy Indications:              Screening for colorectal malignant neoplasm Providers:                Hildred Laser, MD, Lurline Del, RN, Isabella Stalling,                            Technician Referring MD:             Edwinna Areola. [place] Crane MD Medicines:                Meperidine 50 mg IV, Midazolam 6 mg IV Complications:            No immediate complications. Estimated Blood Loss:     Estimated blood loss: none. Procedure:                Pre-Anesthesia Assessment:                           - Prior to the procedure, a History and Physical                            was performed, and patient medications and                            allergies were reviewed. The patient's tolerance of                            previous anesthesia was also reviewed. The risks                            and benefits of the procedure and the sedation                            options and risks were discussed with the patient.                            All questions were answered, and informed consent                            was obtained. Prior Anticoagulants: The patient                            last took aspirin 2 days prior to the procedure.                            ASA Grade Assessment: III - A patient with severe                            systemic disease. After reviewing the risks and  benefits, the patient was deemed in satisfactory                            condition to undergo the procedure.                           After obtaining informed consent, the colonoscope                            was passed under direct vision. Throughout the                            procedure, the patient's blood pressure, pulse, and     oxygen saturations were monitored continuously. The                            [id number] scope was introduced through                            the anus and advanced to the the cecum, identified                            by appendiceal orifice and ileocecal valve. The                            colonoscopy was somewhat difficult due to                            significant looping. Successful completion of the                            procedure was aided by changing the patient's                            position and applying abdominal pressure. The                            patient tolerated the procedure well. The quality                            of the bowel preparation was adequate to identify                            polyps. The ileocecal valve, appendiceal orifice,                            and rectum were photographed. Scope In: 9:21:48 AM Scope Out: 9:44:55 AM Scope Withdrawal Time: 0 hours 8 minutes 3 seconds  Total Procedure Duration: 0 hours 23 minutes 7 seconds  Findings:      The colon (entire examined portion) appeared normal.      External hemorrhoids were found during retroflexion. The hemorrhoids       were small. Impression:               - The entire examined colon  is normal.                           - External hemorrhoids.                           - No specimens collected. Moderate Sedation:      Moderate (conscious) sedation was administered by the endoscopy nurse       and supervised by the endoscopist. The following parameters were       monitored: oxygen saturation, heart rate, blood pressure, CO2       capnography and response to care. Total physician intraservice time was       29 minutes. Recommendation:           - Patient has a contact number available for                            emergencies. The signs and symptoms of potential                            delayed complications were discussed with the                             patient. Return to normal activities tomorrow.                            Written discharge instructions were provided to the                            patient.                           - Resume previous diet today.                           - Continue present medications.                           - Resume aspirin at prior dose today.                           - Repeat colonoscopy in 10 years for screening                            purposes. Procedure Code(s):        --- Professional ---                           [phone], Colonoscopy, flexible; diagnostic, including                            collection of specimen(s) by brushing or washing,                            when performed (separate procedure)  [id number], Moderate sedation services provided by the                            same physician or other qualified health care                            professional performing the diagnostic or                            therapeutic service that the sedation supports,                            requiring the presence of an independent trained                            observer to assist in the monitoring of the                            patient's level of consciousness and physiological                            status; initial 15 minutes of intraservice time,                            patient age 23 years or older                           [phone], Moderate sedation services; each additional                            15 minutes intraservice time Diagnosis Code(s):        --- Professional ---                           Z12.11, Encounter for screening for malignant                            neoplasm of colon                           K64.4, Residual hemorrhoidal skin tags CPT copyright 2016 [employer]. All rights reserved. The codes documented in this report are preliminary and upon coder review may  be revised to meet current compliance  requirements. Hildred Laser, MD Hildred Laser, MD 03/06/2016 9:51:10 AM This report has been signed electronically. Number of Addenda: 0

## 2016-03-11 ENCOUNTER — Encounter (HOSPITAL_COMMUNITY): Payer: Self-pay | Admitting: Internal Medicine

## 2016-05-16 DIAGNOSIS — E119 Type 2 diabetes mellitus without complications: Secondary | ICD-10-CM | POA: Diagnosis not present

## 2016-05-16 DIAGNOSIS — I10 Essential (primary) hypertension: Secondary | ICD-10-CM | POA: Diagnosis not present

## 2016-05-20 DIAGNOSIS — I2510 Atherosclerotic heart disease of native coronary artery without angina pectoris: Secondary | ICD-10-CM | POA: Diagnosis not present

## 2016-05-20 DIAGNOSIS — R1084 Generalized abdominal pain: Secondary | ICD-10-CM | POA: Diagnosis not present

## 2016-05-20 DIAGNOSIS — Z23 Encounter for immunization: Secondary | ICD-10-CM | POA: Diagnosis not present

## 2016-05-20 DIAGNOSIS — E119 Type 2 diabetes mellitus without complications: Secondary | ICD-10-CM | POA: Diagnosis not present

## 2016-05-20 DIAGNOSIS — Z6823 Body mass index (BMI) 23.0-23.9, adult: Secondary | ICD-10-CM | POA: Diagnosis not present

## 2016-05-20 DIAGNOSIS — E782 Mixed hyperlipidemia: Secondary | ICD-10-CM | POA: Diagnosis not present

## 2016-05-20 DIAGNOSIS — I714 Abdominal aortic aneurysm, without rupture: Secondary | ICD-10-CM | POA: Diagnosis not present

## 2016-05-20 DIAGNOSIS — G4709 Other insomnia: Secondary | ICD-10-CM | POA: Diagnosis not present

## 2016-06-26 DIAGNOSIS — E785 Hyperlipidemia, unspecified: Secondary | ICD-10-CM | POA: Diagnosis not present

## 2016-06-26 DIAGNOSIS — I714 Abdominal aortic aneurysm, without rupture: Secondary | ICD-10-CM | POA: Diagnosis not present

## 2016-06-26 DIAGNOSIS — I2510 Atherosclerotic heart disease of native coronary artery without angina pectoris: Secondary | ICD-10-CM | POA: Diagnosis not present

## 2016-06-26 DIAGNOSIS — E119 Type 2 diabetes mellitus without complications: Secondary | ICD-10-CM | POA: Diagnosis not present

## 2016-06-26 DIAGNOSIS — Z951 Presence of aortocoronary bypass graft: Secondary | ICD-10-CM | POA: Diagnosis not present

## 2016-06-26 DIAGNOSIS — Z720 Tobacco use: Secondary | ICD-10-CM | POA: Diagnosis not present

## 2016-06-26 DIAGNOSIS — I10 Essential (primary) hypertension: Secondary | ICD-10-CM | POA: Diagnosis not present

## 2016-08-24 DIAGNOSIS — Z719 Counseling, unspecified: Secondary | ICD-10-CM | POA: Diagnosis not present

## 2016-08-24 DIAGNOSIS — J069 Acute upper respiratory infection, unspecified: Secondary | ICD-10-CM | POA: Diagnosis not present

## 2016-08-29 DIAGNOSIS — I2510 Atherosclerotic heart disease of native coronary artery without angina pectoris: Secondary | ICD-10-CM | POA: Diagnosis not present

## 2016-08-29 DIAGNOSIS — I10 Essential (primary) hypertension: Secondary | ICD-10-CM | POA: Diagnosis not present

## 2016-08-29 DIAGNOSIS — I714 Abdominal aortic aneurysm, without rupture: Secondary | ICD-10-CM | POA: Diagnosis not present

## 2016-08-29 DIAGNOSIS — Z6823 Body mass index (BMI) 23.0-23.9, adult: Secondary | ICD-10-CM | POA: Diagnosis not present

## 2016-08-29 DIAGNOSIS — E782 Mixed hyperlipidemia: Secondary | ICD-10-CM | POA: Diagnosis not present

## 2016-09-02 DIAGNOSIS — E785 Hyperlipidemia, unspecified: Secondary | ICD-10-CM | POA: Diagnosis not present

## 2016-09-02 DIAGNOSIS — I701 Atherosclerosis of renal artery: Secondary | ICD-10-CM | POA: Diagnosis not present

## 2016-09-02 DIAGNOSIS — T82855A Stenosis of coronary artery stent, initial encounter: Secondary | ICD-10-CM | POA: Diagnosis not present

## 2016-09-02 DIAGNOSIS — I25810 Atherosclerosis of coronary artery bypass graft(s) without angina pectoris: Secondary | ICD-10-CM | POA: Diagnosis not present

## 2016-09-02 DIAGNOSIS — E119 Type 2 diabetes mellitus without complications: Secondary | ICD-10-CM | POA: Diagnosis not present

## 2016-09-02 DIAGNOSIS — Y832 Surgical operation with anastomosis, bypass or graft as the cause of abnormal reaction of the patient, or of later complication, without mention of misadventure at the time of the procedure: Secondary | ICD-10-CM | POA: Diagnosis not present

## 2016-09-02 DIAGNOSIS — I25118 Atherosclerotic heart disease of native coronary artery with other forms of angina pectoris: Secondary | ICD-10-CM | POA: Diagnosis not present

## 2016-09-02 DIAGNOSIS — I10 Essential (primary) hypertension: Secondary | ICD-10-CM | POA: Diagnosis not present

## 2016-09-02 DIAGNOSIS — R69 Illness, unspecified: Secondary | ICD-10-CM | POA: Diagnosis not present

## 2016-09-02 DIAGNOSIS — Z951 Presence of aortocoronary bypass graft: Secondary | ICD-10-CM | POA: Diagnosis not present

## 2016-09-02 DIAGNOSIS — I208 Other forms of angina pectoris: Secondary | ICD-10-CM | POA: Diagnosis not present

## 2016-09-02 DIAGNOSIS — I252 Old myocardial infarction: Secondary | ICD-10-CM | POA: Diagnosis not present

## 2016-09-02 DIAGNOSIS — I714 Abdominal aortic aneurysm, without rupture: Secondary | ICD-10-CM | POA: Diagnosis not present

## 2016-09-03 DIAGNOSIS — I208 Other forms of angina pectoris: Secondary | ICD-10-CM | POA: Diagnosis not present

## 2016-09-03 DIAGNOSIS — Z9289 Personal history of other medical treatment: Secondary | ICD-10-CM | POA: Diagnosis not present

## 2016-09-17 DIAGNOSIS — E782 Mixed hyperlipidemia: Secondary | ICD-10-CM | POA: Diagnosis not present

## 2016-09-17 DIAGNOSIS — E119 Type 2 diabetes mellitus without complications: Secondary | ICD-10-CM | POA: Diagnosis not present

## 2016-09-19 DIAGNOSIS — Z0000 Encounter for general adult medical examination without abnormal findings: Secondary | ICD-10-CM | POA: Diagnosis not present

## 2016-09-19 DIAGNOSIS — I714 Abdominal aortic aneurysm, without rupture: Secondary | ICD-10-CM | POA: Diagnosis not present

## 2016-09-19 DIAGNOSIS — G4700 Insomnia, unspecified: Secondary | ICD-10-CM | POA: Diagnosis not present

## 2016-09-19 DIAGNOSIS — E782 Mixed hyperlipidemia: Secondary | ICD-10-CM | POA: Diagnosis not present

## 2016-09-19 DIAGNOSIS — R1084 Generalized abdominal pain: Secondary | ICD-10-CM | POA: Diagnosis not present

## 2016-09-19 DIAGNOSIS — I2510 Atherosclerotic heart disease of native coronary artery without angina pectoris: Secondary | ICD-10-CM | POA: Diagnosis not present

## 2016-09-19 DIAGNOSIS — E119 Type 2 diabetes mellitus without complications: Secondary | ICD-10-CM | POA: Diagnosis not present

## 2016-09-19 DIAGNOSIS — R945 Abnormal results of liver function studies: Secondary | ICD-10-CM | POA: Diagnosis not present

## 2016-09-26 DIAGNOSIS — I2510 Atherosclerotic heart disease of native coronary artery without angina pectoris: Secondary | ICD-10-CM | POA: Diagnosis not present

## 2016-09-26 DIAGNOSIS — I10 Essential (primary) hypertension: Secondary | ICD-10-CM | POA: Diagnosis not present

## 2016-09-26 DIAGNOSIS — I714 Abdominal aortic aneurysm, without rupture: Secondary | ICD-10-CM | POA: Diagnosis not present

## 2016-09-26 DIAGNOSIS — Z6823 Body mass index (BMI) 23.0-23.9, adult: Secondary | ICD-10-CM | POA: Diagnosis not present

## 2016-09-26 DIAGNOSIS — Z720 Tobacco use: Secondary | ICD-10-CM | POA: Diagnosis not present

## 2016-10-04 DIAGNOSIS — J302 Other seasonal allergic rhinitis: Secondary | ICD-10-CM | POA: Diagnosis not present

## 2016-10-04 DIAGNOSIS — J069 Acute upper respiratory infection, unspecified: Secondary | ICD-10-CM | POA: Diagnosis not present

## 2016-10-14 DIAGNOSIS — R05 Cough: Secondary | ICD-10-CM | POA: Diagnosis not present

## 2016-10-14 DIAGNOSIS — J0110 Acute frontal sinusitis, unspecified: Secondary | ICD-10-CM | POA: Diagnosis not present

## 2016-12-05 DIAGNOSIS — M79672 Pain in left foot: Secondary | ICD-10-CM | POA: Diagnosis not present

## 2016-12-05 DIAGNOSIS — M10072 Idiopathic gout, left ankle and foot: Secondary | ICD-10-CM | POA: Diagnosis not present

## 2016-12-13 DIAGNOSIS — E119 Type 2 diabetes mellitus without complications: Secondary | ICD-10-CM | POA: Diagnosis not present

## 2016-12-13 DIAGNOSIS — E782 Mixed hyperlipidemia: Secondary | ICD-10-CM | POA: Diagnosis not present

## 2016-12-17 DIAGNOSIS — I2510 Atherosclerotic heart disease of native coronary artery without angina pectoris: Secondary | ICD-10-CM | POA: Diagnosis not present

## 2016-12-17 DIAGNOSIS — I714 Abdominal aortic aneurysm, without rupture: Secondary | ICD-10-CM | POA: Diagnosis not present

## 2016-12-17 DIAGNOSIS — E782 Mixed hyperlipidemia: Secondary | ICD-10-CM | POA: Diagnosis not present

## 2016-12-17 DIAGNOSIS — Z6822 Body mass index (BMI) 22.0-22.9, adult: Secondary | ICD-10-CM | POA: Diagnosis not present

## 2016-12-17 DIAGNOSIS — E119 Type 2 diabetes mellitus without complications: Secondary | ICD-10-CM | POA: Diagnosis not present

## 2016-12-17 DIAGNOSIS — I10 Essential (primary) hypertension: Secondary | ICD-10-CM | POA: Diagnosis not present

## 2016-12-17 DIAGNOSIS — M109 Gout, unspecified: Secondary | ICD-10-CM | POA: Diagnosis not present

## 2016-12-17 DIAGNOSIS — R945 Abnormal results of liver function studies: Secondary | ICD-10-CM | POA: Diagnosis not present

## 2017-01-02 DIAGNOSIS — Z720 Tobacco use: Secondary | ICD-10-CM | POA: Diagnosis not present

## 2017-01-02 DIAGNOSIS — I714 Abdominal aortic aneurysm, without rupture: Secondary | ICD-10-CM | POA: Diagnosis not present

## 2017-01-02 DIAGNOSIS — Z6821 Body mass index (BMI) 21.0-21.9, adult: Secondary | ICD-10-CM | POA: Diagnosis not present

## 2017-01-02 DIAGNOSIS — I10 Essential (primary) hypertension: Secondary | ICD-10-CM | POA: Diagnosis not present

## 2017-01-02 DIAGNOSIS — I208 Other forms of angina pectoris: Secondary | ICD-10-CM | POA: Diagnosis not present

## 2017-01-02 DIAGNOSIS — I2510 Atherosclerotic heart disease of native coronary artery without angina pectoris: Secondary | ICD-10-CM | POA: Diagnosis not present

## 2017-03-20 DIAGNOSIS — I10 Essential (primary) hypertension: Secondary | ICD-10-CM | POA: Diagnosis not present

## 2017-03-20 DIAGNOSIS — E119 Type 2 diabetes mellitus without complications: Secondary | ICD-10-CM | POA: Diagnosis not present

## 2017-03-21 DIAGNOSIS — I714 Abdominal aortic aneurysm, without rupture: Secondary | ICD-10-CM | POA: Diagnosis not present

## 2017-03-21 DIAGNOSIS — I10 Essential (primary) hypertension: Secondary | ICD-10-CM | POA: Diagnosis not present

## 2017-03-21 DIAGNOSIS — R945 Abnormal results of liver function studies: Secondary | ICD-10-CM | POA: Diagnosis not present

## 2017-03-21 DIAGNOSIS — I2510 Atherosclerotic heart disease of native coronary artery without angina pectoris: Secondary | ICD-10-CM | POA: Diagnosis not present

## 2017-03-21 DIAGNOSIS — R69 Illness, unspecified: Secondary | ICD-10-CM | POA: Diagnosis not present

## 2017-03-21 DIAGNOSIS — M1000 Idiopathic gout, unspecified site: Secondary | ICD-10-CM | POA: Diagnosis not present

## 2017-03-21 DIAGNOSIS — Z6822 Body mass index (BMI) 22.0-22.9, adult: Secondary | ICD-10-CM | POA: Diagnosis not present

## 2017-03-21 DIAGNOSIS — E119 Type 2 diabetes mellitus without complications: Secondary | ICD-10-CM | POA: Diagnosis not present

## 2017-03-21 DIAGNOSIS — E782 Mixed hyperlipidemia: Secondary | ICD-10-CM | POA: Diagnosis not present

## 2017-04-07 DIAGNOSIS — E119 Type 2 diabetes mellitus without complications: Secondary | ICD-10-CM | POA: Diagnosis not present

## 2017-06-18 DIAGNOSIS — E782 Mixed hyperlipidemia: Secondary | ICD-10-CM | POA: Diagnosis not present

## 2017-06-18 DIAGNOSIS — E119 Type 2 diabetes mellitus without complications: Secondary | ICD-10-CM | POA: Diagnosis not present

## 2017-06-18 DIAGNOSIS — I10 Essential (primary) hypertension: Secondary | ICD-10-CM | POA: Diagnosis not present

## 2017-06-19 ENCOUNTER — Emergency Department (HOSPITAL_COMMUNITY)
Admission: EM | Admit: 2017-06-19 | Discharge: 2017-06-20 | Disposition: A | Discharge status: Home / Self Care | Payer: Medicare HMO | Attending: Emergency Medicine | Admitting: Emergency Medicine

## 2017-06-19 ENCOUNTER — Other Ambulatory Visit: Payer: Self-pay

## 2017-06-19 ENCOUNTER — Emergency Department (HOSPITAL_COMMUNITY): Payer: Medicare HMO

## 2017-06-19 ENCOUNTER — Encounter (HOSPITAL_COMMUNITY): Payer: Self-pay | Admitting: *Deleted

## 2017-06-19 DIAGNOSIS — E119 Type 2 diabetes mellitus without complications: Secondary | ICD-10-CM | POA: Diagnosis not present

## 2017-06-19 DIAGNOSIS — F17210 Nicotine dependence, cigarettes, uncomplicated: Secondary | ICD-10-CM | POA: Diagnosis not present

## 2017-06-19 DIAGNOSIS — I2510 Atherosclerotic heart disease of native coronary artery without angina pectoris: Secondary | ICD-10-CM | POA: Diagnosis not present

## 2017-06-19 DIAGNOSIS — I10 Essential (primary) hypertension: Secondary | ICD-10-CM | POA: Insufficient documentation

## 2017-06-19 DIAGNOSIS — Z7982 Long term (current) use of aspirin: Secondary | ICD-10-CM | POA: Diagnosis not present

## 2017-06-19 DIAGNOSIS — Z79899 Other long term (current) drug therapy: Secondary | ICD-10-CM | POA: Insufficient documentation

## 2017-06-19 DIAGNOSIS — R69 Illness, unspecified: Secondary | ICD-10-CM | POA: Diagnosis not present

## 2017-06-19 DIAGNOSIS — J441 Chronic obstructive pulmonary disease with (acute) exacerbation: Secondary | ICD-10-CM | POA: Insufficient documentation

## 2017-06-19 DIAGNOSIS — R062 Wheezing: Secondary | ICD-10-CM | POA: Insufficient documentation

## 2017-06-19 DIAGNOSIS — R0602 Shortness of breath: Secondary | ICD-10-CM | POA: Diagnosis present

## 2017-06-19 DIAGNOSIS — R05 Cough: Secondary | ICD-10-CM | POA: Insufficient documentation

## 2017-06-19 DIAGNOSIS — Z7984 Long term (current) use of oral hypoglycemic drugs: Secondary | ICD-10-CM | POA: Insufficient documentation

## 2017-06-19 LAB — BASIC METABOLIC PANEL
Anion gap: 9 (ref 5–15)
BUN: 20 mg/dL (ref 6–20)
CHLORIDE: 107 mmol/L (ref 101–111)
CO2: 22 mmol/L (ref 22–32)
Calcium: 9.4 mg/dL (ref 8.9–10.3)
Creatinine, Ser: 0.86 mg/dL (ref 0.44–1.00)
GFR calc Af Amer: >60 mL/min (ref >60)
Glucose, Bld: 120 mg/dL — ABNORMAL HIGH (ref 65–99)
Potassium: 3.8 mmol/L (ref 3.5–5.1)
SODIUM: 138 mmol/L (ref 135–145)

## 2017-06-19 LAB — CBC
HCT: 39.1 % (ref 36.0–46.0)
Hemoglobin: 12.2 g/dL (ref 12.0–15.0)
MCH: 29.2 pg (ref 26.0–34.0)
MCHC: 31.2 g/dL (ref 30.0–36.0)
MCV: 93.5 fL (ref 78.0–100.0)
PLATELETS: 193 10*3/uL (ref 150–400)
RBC: 4.18 MIL/uL (ref 3.87–5.11)
RDW: 14 % (ref 11.5–15.5)
WBC: 9.6 10*3/uL (ref 4.0–10.5)

## 2017-06-19 LAB — TROPONIN I

## 2017-06-19 MED ORDER — PREDNISONE 20 MG PO TABS: 40.0000 mg | ORAL_TABLET | Freq: Every day | ORAL | 0 refills | Status: AC

## 2017-06-19 MED ORDER — IPRATROPIUM-ALBUTEROL 0.5-2.5 (3) MG/3ML IN SOLN
3.0000 mL | Freq: Once | RESPIRATORY_TRACT | Status: AC
  Administered 2017-06-19: 3 mL via RESPIRATORY_TRACT
  Filled 2017-06-19: qty 3

## 2017-06-19 MED ORDER — BENZONATATE 100 MG PO CAPS
100.0000 mg | ORAL_CAPSULE | Freq: Once | ORAL | Status: AC
  Administered 2017-06-19: 100 mg via ORAL
  Filled 2017-06-19: qty 1

## 2017-06-19 MED ORDER — AMLODIPINE BESYLATE 5 MG PO TABS
5.0000 mg | ORAL_TABLET | Freq: Once | ORAL | Status: AC
  Administered 2017-06-19: 5 mg via ORAL
  Filled 2017-06-19: qty 1

## 2017-06-19 MED ORDER — BENZONATATE 100 MG PO CAPS
100.0000 mg | ORAL_CAPSULE | Freq: Three times a day (TID) | ORAL | 0 refills | Status: DC
Start: 2017-06-19 — End: 2021-05-19

## 2017-06-19 MED ORDER — DOXYCYCLINE HYCLATE 100 MG PO CAPS: 100.0000 mg | ORAL_CAPSULE | Freq: Two times a day (BID) | ORAL | 0 refills | Status: DC

## 2017-06-19 MED ORDER — PREDNISONE 20 MG PO TABS
40.0000 mg | ORAL_TABLET | Freq: Once | ORAL | Status: AC
Start: 2017-06-19 — End: 2017-06-19
  Administered 2017-06-19: 40 mg via ORAL
  Filled 2017-06-19: qty 2

## 2017-06-19 NOTE — ED Provider Notes (Signed)
[facility] EMERGENCY DEPARTMENT Provider Note   CSN: [id number] Arrival date & time: 06/19/17  2205     History   Chief Complaint Chief Complaint  Patient presents with  . Cough    HPI Alicia Ortega is a 66 y.o. female.  HPI Patient presents to the emergency room for evaluation of shortness of breath.  Has a long smoking history.  She states she has recurrent bouts of pneumonia and bronchitis but denies COPD.  Patient states she has was coughing today.  She started to feel more short of breath.  She used a breathing treatment before coming into the ED.  She denies any trouble with chest pain.  No fevers.  No leg swelling. Past Medical History:  Diagnosis Date  . Coronary artery disease   . Diabetes mellitus without complication ([place])     There are no active problems to display for this patient.   Past Surgical History:  Procedure Laterality Date  . [place]  . CHOLECYSTECTOMY    . COLONOSCOPY N/A 03/06/2016   Procedure: COLONOSCOPY;  Surgeon: Rogene Houston, MD;  Location: [facility] ENDO SUITE;  Service: Endoscopy;  Laterality: N/A;  930  . CORONARY ANGIOPLASTY WITH STENT PLACEMENT    . CORONARY ARTERY BYPASS GRAFT      OB History    No data available       Home Medications    Prior to Admission medications   Medication Sig Start Date End Date Taking? Authorizing Provider  albuterol (PROVENTIL HFA;VENTOLIN HFA) 108 (90 Base) MCG/ACT inhaler Inhale 1-2 puffs into the lungs every 6 (six) hours as needed for wheezing or shortness of breath.   Yes [provider]  aspirin EC 81 MG tablet Take 81 mg by mouth daily.   Yes [provider]  cetirizine (ZYRTEC) 10 MG tablet Take 10 mg by mouth daily.   Yes [provider]  dextromethorphan-guaiFENesin (MUCINEX DM) 30-600 MG 12hr tablet Take 1 tablet by mouth 2 (two) times daily as needed for cough.   Yes [provider]  fenofibrate micronized (LOFIBRA) 134 MG  capsule Take 134 mg by mouth daily before breakfast.   Yes [provider]  glimepiride (AMARYL) 2 MG tablet Take 2 mg by mouth 2 (two) times daily. 03/21/17  Yes [provider]  metFORMIN (GLUCOPHAGE) 1000 MG tablet Take 1,000 mg by mouth 2 (two) times daily.   Yes [provider]  metoprolol tartrate (LOPRESSOR) 50 MG tablet Take 50 mg by mouth 2 (two) times daily.    Yes [provider]  Multiple Vitamin (MULTIVITAMIN WITH MINERALS) TABS tablet Take 1 tablet by mouth daily.   Yes [provider]  olmesartan (BENICAR) 40 MG tablet Take 40 mg by mouth daily.    Yes [provider]  pioglitazone (ACTOS) 15 MG tablet Take 15 mg by mouth daily.   Yes [provider]  prasugrel (EFFIENT) 10 MG TABS tablet Take 10 mg by mouth daily.   Yes [provider]  simvastatin (ZOCOR) 40 MG tablet Take 40 mg by mouth at bedtime.   Yes [provider]  zolpidem (AMBIEN) 10 MG tablet Take 10 mg by mouth at bedtime.   Yes [provider]  benzonatate (TESSALON) 100 MG capsule Take 1 capsule (100 mg total) by mouth every 8 (eight) hours. 06/19/17   Dorie Rank, MD  doxycycline (VIBRAMYCIN) 100 MG capsule Take 1 capsule (100 mg total) by mouth  2 (two) times daily. 06/19/17   Dorie Rank, MD  predniSONE (DELTASONE) 20 MG tablet Take 2 tablets (40 mg total) by mouth daily for 5 days. 06/19/17 06/24/17  Dorie Rank, MD    Family History No family history on file.  Social History Social History   Tobacco Use  . Smoking status: Current Every Day Smoker  . Smokeless tobacco: Never Used  Substance Use Topics  . Alcohol use: Yes    Comment: rarely  . Drug use: No     Allergies   Codeine; Hydrocodone; and Lipitor [atorvastatin]   Review of Systems Review of Systems  All other systems reviewed and are negative.    Physical Exam Updated Vital Signs BP (!) 191/62   Pulse 68   Temp 97.8 F (36.6 C) (Oral)   Resp 19    Ht 1.575 m (5\' 2" )   Wt 57.2 kg (126 lb)   SpO2 95%   BMI 23.05 kg/m   Physical Exam  Constitutional: She appears well-developed and well-nourished. No distress.  HENT:  Head: Normocephalic and atraumatic.  Right Ear: External ear normal.  Left Ear: External ear normal.  Eyes: Conjunctivae are normal. Right eye exhibits no discharge. Left eye exhibits no discharge. No scleral icterus.  Neck: Neck supple. No tracheal deviation present.  Cardiovascular: Normal rate, regular rhythm and intact distal pulses.  Pulmonary/Chest: Effort normal and breath sounds normal. No stridor. No respiratory distress. She has no wheezes. She has no rales.  Abdominal: Soft. Bowel sounds are normal. She exhibits no distension. There is no tenderness. There is no rebound and no guarding.  Musculoskeletal: She exhibits no edema or tenderness.  Neurological: She is alert. She has normal strength. No cranial nerve deficit (no facial droop, extraocular movements intact, no slurred speech) or sensory deficit. She exhibits normal muscle tone. She displays no seizure activity. Coordination normal.  Skin: Skin is warm and dry. No rash noted.  Psychiatric: She has a normal mood and affect.  Nursing note and vitals reviewed.    ED Treatments / Results  Labs (all labs ordered are listed, but only abnormal results are displayed) Labs Reviewed  BASIC METABOLIC PANEL - Abnormal; Notable for the following components:      Result Value   Glucose, Bld 120 (*)    All other components within normal limits  CBC  TROPONIN I    EKG  EKG Interpretation  Date/Time:  Thursday June 19 2017 22:41:00 EST Ventricular Rate:  67 PR Interval:    QRS Duration: 83 QT Interval:  387 QTC Calculation: 409 R Axis:   85 Text Interpretation:  Sinus rhythm Anterior infarct, old No significant change since last tracing Confirmed by Dorie Rank [phone]) on 06/19/2017 10:45:31 PM       Radiology Dg Chest 2 View  Result Date:  06/19/2017 CLINICAL DATA:  Dyspnea and dry cough today. EXAM: CHEST  2 VIEW COMPARISON:  10/23/2013 FINDINGS: Stable mild cardiomegaly with aortic atherosclerosis. Status post CABG. Mild vascular congestion consistent with CHF. No effusion or pneumothorax. IMPRESSION: Cardiomegaly with coronary arteriosclerosis. Mild pulmonary vascular congestion consistent with CHF. Electronically Signed   By: Ashley Royalty M.D.   On: 06/19/2017 23:17    Procedures Procedures (including critical care time)  Medications Ordered in ED Medications  ipratropium-albuterol (DUONEB) 0.5-2.5 (3) MG/3ML nebulizer solution 3 mL (3 mLs Nebulization Given 06/19/17 2250)  predniSONE (DELTASONE) tablet 40 mg (40 mg Oral Given 06/19/17 2341)  benzonatate (TESSALON) capsule 100 mg (100 mg Oral Given  06/19/17 2341)  amLODipine (NORVASC) tablet 5 mg (5 mg Oral Given 06/19/17 2350)     Initial Impression / Assessment and Plan / ED Course  I have reviewed the triage vital signs and the nursing notes.  Pertinent labs & imaging results that were available during my care of the patient were reviewed by me and considered in my medical decision making (see chart for details).   Patient presented to the emergency room with complaints of shortness of breath.  Patient has been wheezing and coughing.  He denies any chest pain.  She denies any history of CHF.  Patient was given a breathing treatment with some improvement.  Chest x-ray suggest the possibility of congestive heart failure however favor that her symptoms are related to a COPD exacerbation.  Patient's blood pressure was elevated.  She states she has not had her regular blood pressure medication recently.  We will give her a dose of Norvasc.  We discussed taking the prednisone.  She knows to watch her blood sugar closely.  Patient felt she needed antibiotics.  I suggested she try the steroids first.  I did give her prescription for antibiotics if she is not improving.  Final  Clinical Impressions(s) / ED Diagnoses   Final diagnoses:  COPD exacerbation ([place])  Hypertension, unspecified type    ED Discharge Orders        Ordered    benzonatate (TESSALON) 100 MG capsule  Every 8 hours     06/19/17 2354    predniSONE (DELTASONE) 20 MG tablet  Daily     06/19/17 2354    doxycycline (VIBRAMYCIN) 100 MG capsule  2 times daily     06/19/17 2354       Dorie Rank, MD 06/19/17 2356

## 2017-06-19 NOTE — Discharge Instructions (Addendum)
The medications as prescribed, follow-up with your primary care doctor if you not improving.  Start taking the doxycycline(abx) if you start noticing a change in your sputum in the next couple of days.  Return for worsening symptoms

## 2017-06-19 NOTE — ED Triage Notes (Signed)
Pt c/o cough that is non productive that started this am,

## 2017-06-20 NOTE — ED Notes (Signed)
Pt alert & oriented x4, stable gait. Patient given discharge instructions, paperwork & prescription(s). Patient  instructed to stop at the registration desk to finish any additional paperwork. Patient verbalized understanding. Pt left department w/ no further questions. 

## 2017-06-23 DIAGNOSIS — R945 Abnormal results of liver function studies: Secondary | ICD-10-CM | POA: Diagnosis not present

## 2017-06-23 DIAGNOSIS — I10 Essential (primary) hypertension: Secondary | ICD-10-CM | POA: Diagnosis not present

## 2017-06-23 DIAGNOSIS — I714 Abdominal aortic aneurysm, without rupture: Secondary | ICD-10-CM | POA: Diagnosis not present

## 2017-06-23 DIAGNOSIS — I2510 Atherosclerotic heart disease of native coronary artery without angina pectoris: Secondary | ICD-10-CM | POA: Diagnosis not present

## 2017-06-23 DIAGNOSIS — E782 Mixed hyperlipidemia: Secondary | ICD-10-CM | POA: Diagnosis not present

## 2017-06-23 DIAGNOSIS — Z23 Encounter for immunization: Secondary | ICD-10-CM | POA: Diagnosis not present

## 2017-06-23 DIAGNOSIS — M109 Gout, unspecified: Secondary | ICD-10-CM | POA: Diagnosis not present

## 2017-06-23 DIAGNOSIS — E119 Type 2 diabetes mellitus without complications: Secondary | ICD-10-CM | POA: Diagnosis not present

## 2017-06-23 DIAGNOSIS — Z6822 Body mass index (BMI) 22.0-22.9, adult: Secondary | ICD-10-CM | POA: Diagnosis not present

## 2017-06-25 DIAGNOSIS — I714 Abdominal aortic aneurysm, without rupture: Secondary | ICD-10-CM | POA: Diagnosis not present

## 2017-06-25 DIAGNOSIS — I10 Essential (primary) hypertension: Secondary | ICD-10-CM | POA: Diagnosis not present

## 2017-06-25 DIAGNOSIS — E119 Type 2 diabetes mellitus without complications: Secondary | ICD-10-CM | POA: Diagnosis not present

## 2017-06-25 DIAGNOSIS — I252 Old myocardial infarction: Secondary | ICD-10-CM | POA: Diagnosis not present

## 2017-06-25 DIAGNOSIS — Z794 Long term (current) use of insulin: Secondary | ICD-10-CM | POA: Diagnosis not present

## 2017-06-25 DIAGNOSIS — Z951 Presence of aortocoronary bypass graft: Secondary | ICD-10-CM | POA: Diagnosis not present

## 2017-06-25 DIAGNOSIS — E785 Hyperlipidemia, unspecified: Secondary | ICD-10-CM | POA: Diagnosis not present

## 2017-06-25 DIAGNOSIS — I2510 Atherosclerotic heart disease of native coronary artery without angina pectoris: Secondary | ICD-10-CM | POA: Diagnosis not present

## 2017-06-25 DIAGNOSIS — Z87891 Personal history of nicotine dependence: Secondary | ICD-10-CM | POA: Diagnosis not present

## 2017-07-03 DIAGNOSIS — I10 Essential (primary) hypertension: Secondary | ICD-10-CM | POA: Diagnosis not present

## 2017-07-03 DIAGNOSIS — I714 Abdominal aortic aneurysm, without rupture: Secondary | ICD-10-CM | POA: Diagnosis not present

## 2017-07-03 DIAGNOSIS — Z87891 Personal history of nicotine dependence: Secondary | ICD-10-CM | POA: Diagnosis not present

## 2017-07-03 DIAGNOSIS — M79669 Pain in unspecified lower leg: Secondary | ICD-10-CM | POA: Diagnosis not present

## 2017-07-03 DIAGNOSIS — E119 Type 2 diabetes mellitus without complications: Secondary | ICD-10-CM | POA: Diagnosis not present

## 2017-07-03 DIAGNOSIS — I2510 Atherosclerotic heart disease of native coronary artery without angina pectoris: Secondary | ICD-10-CM | POA: Diagnosis not present

## 2017-07-15 DIAGNOSIS — E785 Hyperlipidemia, unspecified: Secondary | ICD-10-CM | POA: Diagnosis not present

## 2017-07-15 DIAGNOSIS — E119 Type 2 diabetes mellitus without complications: Secondary | ICD-10-CM | POA: Diagnosis not present

## 2017-07-15 DIAGNOSIS — R69 Illness, unspecified: Secondary | ICD-10-CM | POA: Diagnosis not present

## 2017-07-15 DIAGNOSIS — I10 Essential (primary) hypertension: Secondary | ICD-10-CM | POA: Diagnosis not present

## 2017-07-15 DIAGNOSIS — M79669 Pain in unspecified lower leg: Secondary | ICD-10-CM | POA: Diagnosis not present

## 2017-08-07 DIAGNOSIS — I714 Abdominal aortic aneurysm, without rupture: Secondary | ICD-10-CM | POA: Diagnosis not present

## 2017-09-11 DIAGNOSIS — J0110 Acute frontal sinusitis, unspecified: Secondary | ICD-10-CM | POA: Diagnosis not present

## 2017-10-01 DIAGNOSIS — I10 Essential (primary) hypertension: Secondary | ICD-10-CM | POA: Diagnosis not present

## 2017-10-01 DIAGNOSIS — E782 Mixed hyperlipidemia: Secondary | ICD-10-CM | POA: Diagnosis not present

## 2017-10-01 DIAGNOSIS — E119 Type 2 diabetes mellitus without complications: Secondary | ICD-10-CM | POA: Diagnosis not present

## 2017-10-03 DIAGNOSIS — I2510 Atherosclerotic heart disease of native coronary artery without angina pectoris: Secondary | ICD-10-CM | POA: Diagnosis not present

## 2017-10-03 DIAGNOSIS — R945 Abnormal results of liver function studies: Secondary | ICD-10-CM | POA: Diagnosis not present

## 2017-10-03 DIAGNOSIS — E782 Mixed hyperlipidemia: Secondary | ICD-10-CM | POA: Diagnosis not present

## 2017-10-03 DIAGNOSIS — I10 Essential (primary) hypertension: Secondary | ICD-10-CM | POA: Diagnosis not present

## 2017-10-03 DIAGNOSIS — E119 Type 2 diabetes mellitus without complications: Secondary | ICD-10-CM | POA: Diagnosis not present

## 2017-10-03 DIAGNOSIS — M109 Gout, unspecified: Secondary | ICD-10-CM | POA: Diagnosis not present

## 2017-10-03 DIAGNOSIS — R69 Illness, unspecified: Secondary | ICD-10-CM | POA: Diagnosis not present

## 2017-10-03 DIAGNOSIS — Z6822 Body mass index (BMI) 22.0-22.9, adult: Secondary | ICD-10-CM | POA: Diagnosis not present

## 2017-10-03 DIAGNOSIS — Z23 Encounter for immunization: Secondary | ICD-10-CM | POA: Diagnosis not present

## 2017-10-03 DIAGNOSIS — Z720 Tobacco use: Secondary | ICD-10-CM | POA: Diagnosis not present

## 2017-10-16 DIAGNOSIS — E782 Mixed hyperlipidemia: Secondary | ICD-10-CM | POA: Diagnosis not present

## 2017-10-16 DIAGNOSIS — I714 Abdominal aortic aneurysm, without rupture: Secondary | ICD-10-CM | POA: Diagnosis not present

## 2017-10-16 DIAGNOSIS — K760 Fatty (change of) liver, not elsewhere classified: Secondary | ICD-10-CM | POA: Diagnosis not present

## 2017-10-16 DIAGNOSIS — R69 Illness, unspecified: Secondary | ICD-10-CM | POA: Diagnosis not present

## 2017-10-16 DIAGNOSIS — I219 Acute myocardial infarction, unspecified: Secondary | ICD-10-CM | POA: Diagnosis not present

## 2017-10-16 DIAGNOSIS — Z6824 Body mass index (BMI) 24.0-24.9, adult: Secondary | ICD-10-CM | POA: Diagnosis not present

## 2017-10-16 DIAGNOSIS — E118 Type 2 diabetes mellitus with unspecified complications: Secondary | ICD-10-CM | POA: Diagnosis not present

## 2017-10-16 DIAGNOSIS — I10 Essential (primary) hypertension: Secondary | ICD-10-CM | POA: Diagnosis not present

## 2017-10-16 DIAGNOSIS — I2510 Atherosclerotic heart disease of native coronary artery without angina pectoris: Secondary | ICD-10-CM | POA: Diagnosis not present

## 2017-10-16 DIAGNOSIS — I208 Other forms of angina pectoris: Secondary | ICD-10-CM | POA: Diagnosis not present

## 2017-10-22 DIAGNOSIS — I714 Abdominal aortic aneurysm, without rupture: Secondary | ICD-10-CM | POA: Diagnosis not present

## 2017-10-22 DIAGNOSIS — Z6824 Body mass index (BMI) 24.0-24.9, adult: Secondary | ICD-10-CM | POA: Diagnosis not present

## 2017-11-03 DIAGNOSIS — E782 Mixed hyperlipidemia: Secondary | ICD-10-CM | POA: Diagnosis not present

## 2017-11-03 DIAGNOSIS — Z6824 Body mass index (BMI) 24.0-24.9, adult: Secondary | ICD-10-CM | POA: Diagnosis not present

## 2017-11-03 DIAGNOSIS — I2510 Atherosclerotic heart disease of native coronary artery without angina pectoris: Secondary | ICD-10-CM | POA: Diagnosis not present

## 2017-11-03 DIAGNOSIS — R69 Illness, unspecified: Secondary | ICD-10-CM | POA: Diagnosis not present

## 2017-11-03 DIAGNOSIS — I10 Essential (primary) hypertension: Secondary | ICD-10-CM | POA: Diagnosis not present

## 2017-11-03 DIAGNOSIS — Z01818 Encounter for other preprocedural examination: Secondary | ICD-10-CM | POA: Diagnosis not present

## 2017-11-03 DIAGNOSIS — E118 Type 2 diabetes mellitus with unspecified complications: Secondary | ICD-10-CM | POA: Diagnosis not present

## 2017-11-03 DIAGNOSIS — I714 Abdominal aortic aneurysm, without rupture: Secondary | ICD-10-CM | POA: Diagnosis not present

## 2017-11-03 DIAGNOSIS — I208 Other forms of angina pectoris: Secondary | ICD-10-CM | POA: Diagnosis not present

## 2017-11-10 DIAGNOSIS — I5042 Chronic combined systolic (congestive) and diastolic (congestive) heart failure: Secondary | ICD-10-CM | POA: Diagnosis not present

## 2017-11-10 DIAGNOSIS — Z9911 Dependence on respirator [ventilator] status: Secondary | ICD-10-CM | POA: Diagnosis not present

## 2017-11-10 DIAGNOSIS — R9431 Abnormal electrocardiogram [ECG] [EKG]: Secondary | ICD-10-CM | POA: Diagnosis not present

## 2017-11-10 DIAGNOSIS — Z452 Encounter for adjustment and management of vascular access device: Secondary | ICD-10-CM | POA: Diagnosis not present

## 2017-11-10 DIAGNOSIS — D62 Acute posthemorrhagic anemia: Secondary | ICD-10-CM | POA: Diagnosis not present

## 2017-11-10 DIAGNOSIS — J810 Acute pulmonary edema: Secondary | ICD-10-CM | POA: Diagnosis not present

## 2017-11-10 DIAGNOSIS — R109 Unspecified abdominal pain: Secondary | ICD-10-CM | POA: Diagnosis not present

## 2017-11-10 DIAGNOSIS — Z01810 Encounter for preprocedural cardiovascular examination: Secondary | ICD-10-CM | POA: Diagnosis not present

## 2017-11-10 DIAGNOSIS — J988 Other specified respiratory disorders: Secondary | ICD-10-CM | POA: Diagnosis not present

## 2017-11-10 DIAGNOSIS — I340 Nonrheumatic mitral (valve) insufficiency: Secondary | ICD-10-CM | POA: Diagnosis not present

## 2017-11-10 DIAGNOSIS — Z95828 Presence of other vascular implants and grafts: Secondary | ICD-10-CM | POA: Diagnosis not present

## 2017-11-10 DIAGNOSIS — I959 Hypotension, unspecified: Secondary | ICD-10-CM | POA: Diagnosis not present

## 2017-11-10 DIAGNOSIS — Z4682 Encounter for fitting and adjustment of non-vascular catheter: Secondary | ICD-10-CM | POA: Diagnosis not present

## 2017-11-10 DIAGNOSIS — D688 Other specified coagulation defects: Secondary | ICD-10-CM | POA: Diagnosis not present

## 2017-11-10 DIAGNOSIS — R451 Restlessness and agitation: Secondary | ICD-10-CM | POA: Diagnosis not present

## 2017-11-10 DIAGNOSIS — Z87891 Personal history of nicotine dependence: Secondary | ICD-10-CM | POA: Diagnosis not present

## 2017-11-10 DIAGNOSIS — J189 Pneumonia, unspecified organism: Secondary | ICD-10-CM | POA: Diagnosis not present

## 2017-11-10 DIAGNOSIS — Z8679 Personal history of other diseases of the circulatory system: Secondary | ICD-10-CM | POA: Diagnosis not present

## 2017-11-10 DIAGNOSIS — I517 Cardiomegaly: Secondary | ICD-10-CM | POA: Diagnosis not present

## 2017-11-10 DIAGNOSIS — R410 Disorientation, unspecified: Secondary | ICD-10-CM | POA: Diagnosis not present

## 2017-11-10 DIAGNOSIS — R0603 Acute respiratory distress: Secondary | ICD-10-CM | POA: Diagnosis not present

## 2017-11-10 DIAGNOSIS — F05 Delirium due to known physiological condition: Secondary | ICD-10-CM | POA: Diagnosis not present

## 2017-11-10 DIAGNOSIS — I499 Cardiac arrhythmia, unspecified: Secondary | ICD-10-CM | POA: Diagnosis not present

## 2017-11-10 DIAGNOSIS — R4182 Altered mental status, unspecified: Secondary | ICD-10-CM | POA: Diagnosis not present

## 2017-11-10 DIAGNOSIS — G4709 Other insomnia: Secondary | ICD-10-CM | POA: Diagnosis not present

## 2017-11-10 DIAGNOSIS — J9690 Respiratory failure, unspecified, unspecified whether with hypoxia or hypercapnia: Secondary | ICD-10-CM | POA: Diagnosis not present

## 2017-11-10 DIAGNOSIS — J449 Chronic obstructive pulmonary disease, unspecified: Secondary | ICD-10-CM | POA: Diagnosis not present

## 2017-11-10 DIAGNOSIS — R0989 Other specified symptoms and signs involving the circulatory and respiratory systems: Secondary | ICD-10-CM | POA: Diagnosis not present

## 2017-11-10 DIAGNOSIS — I110 Hypertensive heart disease with heart failure: Secondary | ICD-10-CM | POA: Diagnosis not present

## 2017-11-10 DIAGNOSIS — R001 Bradycardia, unspecified: Secondary | ICD-10-CM | POA: Diagnosis not present

## 2017-11-10 DIAGNOSIS — Z951 Presence of aortocoronary bypass graft: Secondary | ICD-10-CM | POA: Diagnosis not present

## 2017-11-10 DIAGNOSIS — G8912 Acute post-thoracotomy pain: Secondary | ICD-10-CM | POA: Diagnosis not present

## 2017-11-10 DIAGNOSIS — J90 Pleural effusion, not elsewhere classified: Secondary | ICD-10-CM | POA: Diagnosis not present

## 2017-11-10 DIAGNOSIS — J441 Chronic obstructive pulmonary disease with (acute) exacerbation: Secondary | ICD-10-CM | POA: Diagnosis not present

## 2017-11-10 DIAGNOSIS — R918 Other nonspecific abnormal finding of lung field: Secondary | ICD-10-CM | POA: Diagnosis not present

## 2017-11-10 DIAGNOSIS — I5022 Chronic systolic (congestive) heart failure: Secondary | ICD-10-CM | POA: Diagnosis not present

## 2017-11-10 DIAGNOSIS — I701 Atherosclerosis of renal artery: Secondary | ICD-10-CM | POA: Diagnosis not present

## 2017-11-10 DIAGNOSIS — J439 Emphysema, unspecified: Secondary | ICD-10-CM | POA: Diagnosis not present

## 2017-11-10 DIAGNOSIS — Z09 Encounter for follow-up examination after completed treatment for conditions other than malignant neoplasm: Secondary | ICD-10-CM | POA: Diagnosis not present

## 2017-11-10 DIAGNOSIS — Z9889 Other specified postprocedural states: Secondary | ICD-10-CM | POA: Diagnosis not present

## 2017-11-10 DIAGNOSIS — Z6822 Body mass index (BMI) 22.0-22.9, adult: Secondary | ICD-10-CM | POA: Diagnosis not present

## 2017-11-10 DIAGNOSIS — I714 Abdominal aortic aneurysm, without rupture: Secondary | ICD-10-CM | POA: Diagnosis not present

## 2017-11-10 DIAGNOSIS — E119 Type 2 diabetes mellitus without complications: Secondary | ICD-10-CM | POA: Diagnosis not present

## 2017-11-10 DIAGNOSIS — I700 Atherosclerosis of aorta: Secondary | ICD-10-CM | POA: Diagnosis not present

## 2017-11-10 DIAGNOSIS — G8918 Other acute postprocedural pain: Secondary | ICD-10-CM | POA: Diagnosis not present

## 2017-11-10 DIAGNOSIS — R0689 Other abnormalities of breathing: Secondary | ICD-10-CM | POA: Diagnosis not present

## 2017-11-10 DIAGNOSIS — R34 Anuria and oliguria: Secondary | ICD-10-CM | POA: Diagnosis not present

## 2017-11-10 DIAGNOSIS — J9811 Atelectasis: Secondary | ICD-10-CM | POA: Diagnosis not present

## 2017-11-10 DIAGNOSIS — R0682 Tachypnea, not elsewhere classified: Secondary | ICD-10-CM | POA: Diagnosis not present

## 2017-11-10 DIAGNOSIS — I2510 Atherosclerotic heart disease of native coronary artery without angina pectoris: Secondary | ICD-10-CM | POA: Diagnosis not present

## 2017-11-10 DIAGNOSIS — J811 Chronic pulmonary edema: Secondary | ICD-10-CM | POA: Diagnosis not present

## 2017-11-10 DIAGNOSIS — J951 Acute pulmonary insufficiency following thoracic surgery: Secondary | ICD-10-CM | POA: Diagnosis not present

## 2017-11-10 DIAGNOSIS — D72829 Elevated white blood cell count, unspecified: Secondary | ICD-10-CM | POA: Diagnosis not present

## 2017-11-10 DIAGNOSIS — J9691 Respiratory failure, unspecified with hypoxia: Secondary | ICD-10-CM | POA: Diagnosis not present

## 2017-11-10 DIAGNOSIS — E874 Mixed disorder of acid-base balance: Secondary | ICD-10-CM | POA: Diagnosis not present

## 2017-11-10 DIAGNOSIS — R69 Illness, unspecified: Secondary | ICD-10-CM | POA: Diagnosis not present

## 2017-11-26 DIAGNOSIS — Z09 Encounter for follow-up examination after completed treatment for conditions other than malignant neoplasm: Secondary | ICD-10-CM | POA: Diagnosis not present

## 2017-11-26 DIAGNOSIS — Z8679 Personal history of other diseases of the circulatory system: Secondary | ICD-10-CM | POA: Diagnosis not present

## 2017-11-26 DIAGNOSIS — I739 Peripheral vascular disease, unspecified: Secondary | ICD-10-CM | POA: Diagnosis not present

## 2017-11-26 DIAGNOSIS — Z9889 Other specified postprocedural states: Secondary | ICD-10-CM | POA: Diagnosis not present

## 2017-11-28 DIAGNOSIS — E119 Type 2 diabetes mellitus without complications: Secondary | ICD-10-CM | POA: Diagnosis not present

## 2017-11-28 DIAGNOSIS — J449 Chronic obstructive pulmonary disease, unspecified: Secondary | ICD-10-CM | POA: Diagnosis not present

## 2017-11-28 DIAGNOSIS — Z23 Encounter for immunization: Secondary | ICD-10-CM | POA: Diagnosis not present

## 2017-11-28 DIAGNOSIS — R945 Abnormal results of liver function studies: Secondary | ICD-10-CM | POA: Diagnosis not present

## 2017-11-28 DIAGNOSIS — I2510 Atherosclerotic heart disease of native coronary artery without angina pectoris: Secondary | ICD-10-CM | POA: Diagnosis not present

## 2017-11-28 DIAGNOSIS — E782 Mixed hyperlipidemia: Secondary | ICD-10-CM | POA: Diagnosis not present

## 2017-11-28 DIAGNOSIS — Z6822 Body mass index (BMI) 22.0-22.9, adult: Secondary | ICD-10-CM | POA: Diagnosis not present

## 2017-11-28 DIAGNOSIS — I714 Abdominal aortic aneurysm, without rupture: Secondary | ICD-10-CM | POA: Diagnosis not present

## 2017-11-28 DIAGNOSIS — I10 Essential (primary) hypertension: Secondary | ICD-10-CM | POA: Diagnosis not present

## 2017-11-28 DIAGNOSIS — M109 Gout, unspecified: Secondary | ICD-10-CM | POA: Diagnosis not present

## 2017-11-28 DIAGNOSIS — R69 Illness, unspecified: Secondary | ICD-10-CM | POA: Diagnosis not present

## 2017-11-28 DIAGNOSIS — Z720 Tobacco use: Secondary | ICD-10-CM | POA: Diagnosis not present

## 2017-12-11 DIAGNOSIS — I739 Peripheral vascular disease, unspecified: Secondary | ICD-10-CM | POA: Diagnosis not present

## 2017-12-11 DIAGNOSIS — D649 Anemia, unspecified: Secondary | ICD-10-CM | POA: Diagnosis not present

## 2017-12-11 DIAGNOSIS — E118 Type 2 diabetes mellitus with unspecified complications: Secondary | ICD-10-CM | POA: Diagnosis not present

## 2017-12-11 DIAGNOSIS — I219 Acute myocardial infarction, unspecified: Secondary | ICD-10-CM | POA: Diagnosis not present

## 2017-12-11 DIAGNOSIS — Z8679 Personal history of other diseases of the circulatory system: Secondary | ICD-10-CM | POA: Diagnosis not present

## 2017-12-11 DIAGNOSIS — E782 Mixed hyperlipidemia: Secondary | ICD-10-CM | POA: Diagnosis not present

## 2017-12-11 DIAGNOSIS — I2510 Atherosclerotic heart disease of native coronary artery without angina pectoris: Secondary | ICD-10-CM | POA: Diagnosis not present

## 2017-12-11 DIAGNOSIS — I10 Essential (primary) hypertension: Secondary | ICD-10-CM | POA: Diagnosis not present

## 2017-12-11 DIAGNOSIS — R69 Illness, unspecified: Secondary | ICD-10-CM | POA: Diagnosis not present

## 2017-12-11 DIAGNOSIS — I714 Abdominal aortic aneurysm, without rupture: Secondary | ICD-10-CM | POA: Diagnosis not present

## 2018-01-09 DIAGNOSIS — Z6822 Body mass index (BMI) 22.0-22.9, adult: Secondary | ICD-10-CM | POA: Diagnosis not present

## 2018-01-09 DIAGNOSIS — J9601 Acute respiratory failure with hypoxia: Secondary | ICD-10-CM | POA: Diagnosis not present

## 2018-01-09 DIAGNOSIS — R69 Illness, unspecified: Secondary | ICD-10-CM | POA: Diagnosis not present

## 2018-01-09 DIAGNOSIS — J449 Chronic obstructive pulmonary disease, unspecified: Secondary | ICD-10-CM | POA: Diagnosis not present

## 2018-01-09 DIAGNOSIS — Z9289 Personal history of other medical treatment: Secondary | ICD-10-CM | POA: Diagnosis not present

## 2018-01-09 DIAGNOSIS — E119 Type 2 diabetes mellitus without complications: Secondary | ICD-10-CM | POA: Diagnosis not present

## 2018-01-09 DIAGNOSIS — J811 Chronic pulmonary edema: Secondary | ICD-10-CM | POA: Diagnosis not present

## 2018-01-09 DIAGNOSIS — I2510 Atherosclerotic heart disease of native coronary artery without angina pectoris: Secondary | ICD-10-CM | POA: Diagnosis not present

## 2018-01-09 DIAGNOSIS — I10 Essential (primary) hypertension: Secondary | ICD-10-CM | POA: Diagnosis not present

## 2018-01-09 DIAGNOSIS — I252 Old myocardial infarction: Secondary | ICD-10-CM | POA: Diagnosis not present

## 2018-01-09 DIAGNOSIS — Z951 Presence of aortocoronary bypass graft: Secondary | ICD-10-CM | POA: Diagnosis not present

## 2018-01-09 DIAGNOSIS — Z885 Allergy status to narcotic agent status: Secondary | ICD-10-CM | POA: Diagnosis not present

## 2018-01-12 DIAGNOSIS — E119 Type 2 diabetes mellitus without complications: Secondary | ICD-10-CM | POA: Diagnosis not present

## 2018-01-14 DIAGNOSIS — I2510 Atherosclerotic heart disease of native coronary artery without angina pectoris: Secondary | ICD-10-CM | POA: Diagnosis not present

## 2018-01-14 DIAGNOSIS — I10 Essential (primary) hypertension: Secondary | ICD-10-CM | POA: Diagnosis not present

## 2018-01-14 DIAGNOSIS — I739 Peripheral vascular disease, unspecified: Secondary | ICD-10-CM | POA: Diagnosis not present

## 2018-01-14 DIAGNOSIS — R945 Abnormal results of liver function studies: Secondary | ICD-10-CM | POA: Diagnosis not present

## 2018-01-14 DIAGNOSIS — E1169 Type 2 diabetes mellitus with other specified complication: Secondary | ICD-10-CM | POA: Diagnosis not present

## 2018-01-14 DIAGNOSIS — I714 Abdominal aortic aneurysm, without rupture: Secondary | ICD-10-CM | POA: Diagnosis not present

## 2018-01-14 DIAGNOSIS — E782 Mixed hyperlipidemia: Secondary | ICD-10-CM | POA: Diagnosis not present

## 2018-01-14 DIAGNOSIS — R69 Illness, unspecified: Secondary | ICD-10-CM | POA: Diagnosis not present

## 2018-01-14 DIAGNOSIS — Z720 Tobacco use: Secondary | ICD-10-CM | POA: Diagnosis not present

## 2018-01-14 DIAGNOSIS — M109 Gout, unspecified: Secondary | ICD-10-CM | POA: Diagnosis not present

## 2018-03-02 DIAGNOSIS — J0110 Acute frontal sinusitis, unspecified: Secondary | ICD-10-CM | POA: Diagnosis not present

## 2018-03-02 DIAGNOSIS — R05 Cough: Secondary | ICD-10-CM | POA: Diagnosis not present

## 2018-03-04 DIAGNOSIS — I10 Essential (primary) hypertension: Secondary | ICD-10-CM | POA: Diagnosis not present

## 2018-03-04 DIAGNOSIS — Z95828 Presence of other vascular implants and grafts: Secondary | ICD-10-CM | POA: Diagnosis not present

## 2018-03-04 DIAGNOSIS — Z6822 Body mass index (BMI) 22.0-22.9, adult: Secondary | ICD-10-CM | POA: Diagnosis not present

## 2018-03-04 DIAGNOSIS — I6529 Occlusion and stenosis of unspecified carotid artery: Secondary | ICD-10-CM | POA: Diagnosis not present

## 2018-03-04 DIAGNOSIS — I714 Abdominal aortic aneurysm, without rupture: Secondary | ICD-10-CM | POA: Diagnosis not present

## 2018-03-04 DIAGNOSIS — Z09 Encounter for follow-up examination after completed treatment for conditions other than malignant neoplasm: Secondary | ICD-10-CM | POA: Diagnosis not present

## 2018-03-06 DIAGNOSIS — R05 Cough: Secondary | ICD-10-CM | POA: Diagnosis not present

## 2018-03-06 DIAGNOSIS — J0110 Acute frontal sinusitis, unspecified: Secondary | ICD-10-CM | POA: Diagnosis not present

## 2018-03-06 DIAGNOSIS — R062 Wheezing: Secondary | ICD-10-CM | POA: Diagnosis not present

## 2018-05-11 DIAGNOSIS — I2510 Atherosclerotic heart disease of native coronary artery without angina pectoris: Secondary | ICD-10-CM | POA: Diagnosis not present

## 2018-05-11 DIAGNOSIS — I10 Essential (primary) hypertension: Secondary | ICD-10-CM | POA: Diagnosis not present

## 2018-05-11 DIAGNOSIS — I519 Heart disease, unspecified: Secondary | ICD-10-CM | POA: Diagnosis not present

## 2018-05-11 DIAGNOSIS — E1169 Type 2 diabetes mellitus with other specified complication: Secondary | ICD-10-CM | POA: Diagnosis not present

## 2018-05-11 DIAGNOSIS — J449 Chronic obstructive pulmonary disease, unspecified: Secondary | ICD-10-CM | POA: Diagnosis not present

## 2018-05-11 DIAGNOSIS — I739 Peripheral vascular disease, unspecified: Secondary | ICD-10-CM | POA: Diagnosis not present

## 2018-05-11 DIAGNOSIS — R69 Illness, unspecified: Secondary | ICD-10-CM | POA: Diagnosis not present

## 2018-05-11 DIAGNOSIS — E782 Mixed hyperlipidemia: Secondary | ICD-10-CM | POA: Diagnosis not present

## 2018-05-11 DIAGNOSIS — M109 Gout, unspecified: Secondary | ICD-10-CM | POA: Diagnosis not present

## 2018-05-11 DIAGNOSIS — I714 Abdominal aortic aneurysm, without rupture: Secondary | ICD-10-CM | POA: Diagnosis not present

## 2018-05-18 DIAGNOSIS — Z23 Encounter for immunization: Secondary | ICD-10-CM | POA: Diagnosis not present

## 2018-05-21 DIAGNOSIS — Z6823 Body mass index (BMI) 23.0-23.9, adult: Secondary | ICD-10-CM | POA: Diagnosis not present

## 2018-05-21 DIAGNOSIS — I714 Abdominal aortic aneurysm, without rupture: Secondary | ICD-10-CM | POA: Diagnosis not present

## 2018-05-21 DIAGNOSIS — I10 Essential (primary) hypertension: Secondary | ICD-10-CM | POA: Diagnosis not present

## 2018-05-21 DIAGNOSIS — E782 Mixed hyperlipidemia: Secondary | ICD-10-CM | POA: Diagnosis not present

## 2018-05-21 DIAGNOSIS — I2510 Atherosclerotic heart disease of native coronary artery without angina pectoris: Secondary | ICD-10-CM | POA: Diagnosis not present

## 2018-06-30 DIAGNOSIS — R05 Cough: Secondary | ICD-10-CM | POA: Diagnosis not present

## 2018-06-30 DIAGNOSIS — J0110 Acute frontal sinusitis, unspecified: Secondary | ICD-10-CM | POA: Diagnosis not present

## 2018-07-08 DIAGNOSIS — R0602 Shortness of breath: Secondary | ICD-10-CM | POA: Diagnosis not present

## 2018-07-14 DIAGNOSIS — I10 Essential (primary) hypertension: Secondary | ICD-10-CM | POA: Diagnosis not present

## 2018-07-14 DIAGNOSIS — E782 Mixed hyperlipidemia: Secondary | ICD-10-CM | POA: Diagnosis not present

## 2018-07-14 DIAGNOSIS — R945 Abnormal results of liver function studies: Secondary | ICD-10-CM | POA: Diagnosis not present

## 2018-07-14 DIAGNOSIS — E119 Type 2 diabetes mellitus without complications: Secondary | ICD-10-CM | POA: Diagnosis not present

## 2018-07-14 DIAGNOSIS — M109 Gout, unspecified: Secondary | ICD-10-CM | POA: Diagnosis not present

## 2018-07-14 DIAGNOSIS — E1169 Type 2 diabetes mellitus with other specified complication: Secondary | ICD-10-CM | POA: Diagnosis not present

## 2018-07-16 DIAGNOSIS — E119 Type 2 diabetes mellitus without complications: Secondary | ICD-10-CM | POA: Diagnosis not present

## 2018-07-31 DIAGNOSIS — I714 Abdominal aortic aneurysm, without rupture: Secondary | ICD-10-CM | POA: Diagnosis not present

## 2018-07-31 DIAGNOSIS — I10 Essential (primary) hypertension: Secondary | ICD-10-CM | POA: Diagnosis not present

## 2018-07-31 DIAGNOSIS — I2510 Atherosclerotic heart disease of native coronary artery without angina pectoris: Secondary | ICD-10-CM | POA: Diagnosis not present

## 2018-07-31 DIAGNOSIS — E119 Type 2 diabetes mellitus without complications: Secondary | ICD-10-CM | POA: Diagnosis not present

## 2018-07-31 DIAGNOSIS — R945 Abnormal results of liver function studies: Secondary | ICD-10-CM | POA: Diagnosis not present

## 2018-07-31 DIAGNOSIS — G4700 Insomnia, unspecified: Secondary | ICD-10-CM | POA: Diagnosis not present

## 2018-07-31 DIAGNOSIS — M109 Gout, unspecified: Secondary | ICD-10-CM | POA: Diagnosis not present

## 2018-07-31 DIAGNOSIS — E782 Mixed hyperlipidemia: Secondary | ICD-10-CM | POA: Diagnosis not present

## 2018-07-31 DIAGNOSIS — I739 Peripheral vascular disease, unspecified: Secondary | ICD-10-CM | POA: Diagnosis not present

## 2018-07-31 DIAGNOSIS — R944 Abnormal results of kidney function studies: Secondary | ICD-10-CM | POA: Diagnosis not present

## 2018-08-13 DIAGNOSIS — E782 Mixed hyperlipidemia: Secondary | ICD-10-CM | POA: Diagnosis not present

## 2018-08-13 DIAGNOSIS — I714 Abdominal aortic aneurysm, without rupture: Secondary | ICD-10-CM | POA: Diagnosis not present

## 2018-08-13 DIAGNOSIS — I739 Peripheral vascular disease, unspecified: Secondary | ICD-10-CM | POA: Diagnosis not present

## 2018-08-13 DIAGNOSIS — Z8679 Personal history of other diseases of the circulatory system: Secondary | ICD-10-CM | POA: Diagnosis not present

## 2018-08-13 DIAGNOSIS — I10 Essential (primary) hypertension: Secondary | ICD-10-CM | POA: Diagnosis not present

## 2018-08-13 DIAGNOSIS — Z6823 Body mass index (BMI) 23.0-23.9, adult: Secondary | ICD-10-CM | POA: Diagnosis not present

## 2018-08-13 DIAGNOSIS — I2510 Atherosclerotic heart disease of native coronary artery without angina pectoris: Secondary | ICD-10-CM | POA: Diagnosis not present

## 2018-09-11 DIAGNOSIS — I739 Peripheral vascular disease, unspecified: Secondary | ICD-10-CM | POA: Diagnosis not present

## 2018-09-11 DIAGNOSIS — I10 Essential (primary) hypertension: Secondary | ICD-10-CM | POA: Diagnosis not present

## 2018-09-11 DIAGNOSIS — I2510 Atherosclerotic heart disease of native coronary artery without angina pectoris: Secondary | ICD-10-CM | POA: Diagnosis not present

## 2018-09-30 DIAGNOSIS — Z87891 Personal history of nicotine dependence: Secondary | ICD-10-CM | POA: Diagnosis not present

## 2018-09-30 DIAGNOSIS — I6523 Occlusion and stenosis of bilateral carotid arteries: Secondary | ICD-10-CM | POA: Diagnosis not present

## 2018-09-30 DIAGNOSIS — I739 Peripheral vascular disease, unspecified: Secondary | ICD-10-CM | POA: Diagnosis not present

## 2018-09-30 DIAGNOSIS — I2510 Atherosclerotic heart disease of native coronary artery without angina pectoris: Secondary | ICD-10-CM | POA: Diagnosis not present

## 2018-09-30 DIAGNOSIS — E785 Hyperlipidemia, unspecified: Secondary | ICD-10-CM | POA: Diagnosis not present

## 2018-09-30 DIAGNOSIS — Z09 Encounter for follow-up examination after completed treatment for conditions other than malignant neoplasm: Secondary | ICD-10-CM | POA: Diagnosis not present

## 2018-09-30 DIAGNOSIS — I252 Old myocardial infarction: Secondary | ICD-10-CM | POA: Diagnosis not present

## 2018-09-30 DIAGNOSIS — I10 Essential (primary) hypertension: Secondary | ICD-10-CM | POA: Diagnosis not present

## 2018-09-30 DIAGNOSIS — I714 Abdominal aortic aneurysm, without rupture: Secondary | ICD-10-CM | POA: Diagnosis not present

## 2018-09-30 DIAGNOSIS — Z95828 Presence of other vascular implants and grafts: Secondary | ICD-10-CM | POA: Diagnosis not present

## 2018-11-02 DIAGNOSIS — E1169 Type 2 diabetes mellitus with other specified complication: Secondary | ICD-10-CM | POA: Diagnosis not present

## 2018-11-02 DIAGNOSIS — I10 Essential (primary) hypertension: Secondary | ICD-10-CM | POA: Diagnosis not present

## 2018-11-02 DIAGNOSIS — M109 Gout, unspecified: Secondary | ICD-10-CM | POA: Diagnosis not present

## 2018-11-02 DIAGNOSIS — I739 Peripheral vascular disease, unspecified: Secondary | ICD-10-CM | POA: Diagnosis not present

## 2018-11-02 DIAGNOSIS — J449 Chronic obstructive pulmonary disease, unspecified: Secondary | ICD-10-CM | POA: Diagnosis not present

## 2018-11-02 DIAGNOSIS — E782 Mixed hyperlipidemia: Secondary | ICD-10-CM | POA: Diagnosis not present

## 2018-11-02 DIAGNOSIS — G4700 Insomnia, unspecified: Secondary | ICD-10-CM | POA: Diagnosis not present

## 2018-11-02 DIAGNOSIS — Z8679 Personal history of other diseases of the circulatory system: Secondary | ICD-10-CM | POA: Diagnosis not present

## 2018-11-12 DIAGNOSIS — J449 Chronic obstructive pulmonary disease, unspecified: Secondary | ICD-10-CM | POA: Diagnosis not present

## 2018-11-12 DIAGNOSIS — I519 Heart disease, unspecified: Secondary | ICD-10-CM | POA: Diagnosis not present

## 2018-11-12 DIAGNOSIS — M109 Gout, unspecified: Secondary | ICD-10-CM | POA: Diagnosis not present

## 2018-11-12 DIAGNOSIS — I10 Essential (primary) hypertension: Secondary | ICD-10-CM | POA: Diagnosis not present

## 2018-11-12 DIAGNOSIS — E119 Type 2 diabetes mellitus without complications: Secondary | ICD-10-CM | POA: Diagnosis not present

## 2018-11-12 DIAGNOSIS — I739 Peripheral vascular disease, unspecified: Secondary | ICD-10-CM | POA: Diagnosis not present

## 2018-11-12 DIAGNOSIS — E1169 Type 2 diabetes mellitus with other specified complication: Secondary | ICD-10-CM | POA: Diagnosis not present

## 2018-11-12 DIAGNOSIS — E782 Mixed hyperlipidemia: Secondary | ICD-10-CM | POA: Diagnosis not present

## 2018-11-12 DIAGNOSIS — I714 Abdominal aortic aneurysm, without rupture: Secondary | ICD-10-CM | POA: Diagnosis not present

## 2018-11-12 DIAGNOSIS — I2510 Atherosclerotic heart disease of native coronary artery without angina pectoris: Secondary | ICD-10-CM | POA: Diagnosis not present

## 2018-12-07 DIAGNOSIS — E1169 Type 2 diabetes mellitus with other specified complication: Secondary | ICD-10-CM | POA: Diagnosis not present

## 2018-12-07 DIAGNOSIS — J449 Chronic obstructive pulmonary disease, unspecified: Secondary | ICD-10-CM | POA: Diagnosis not present

## 2018-12-07 DIAGNOSIS — E782 Mixed hyperlipidemia: Secondary | ICD-10-CM | POA: Diagnosis not present

## 2018-12-07 DIAGNOSIS — I10 Essential (primary) hypertension: Secondary | ICD-10-CM | POA: Diagnosis not present

## 2018-12-18 DIAGNOSIS — Z0000 Encounter for general adult medical examination without abnormal findings: Secondary | ICD-10-CM | POA: Diagnosis not present

## 2019-02-02 DIAGNOSIS — E119 Type 2 diabetes mellitus without complications: Secondary | ICD-10-CM | POA: Diagnosis not present

## 2019-02-02 DIAGNOSIS — E1169 Type 2 diabetes mellitus with other specified complication: Secondary | ICD-10-CM | POA: Diagnosis not present

## 2019-02-02 DIAGNOSIS — R945 Abnormal results of liver function studies: Secondary | ICD-10-CM | POA: Diagnosis not present

## 2019-02-02 DIAGNOSIS — E782 Mixed hyperlipidemia: Secondary | ICD-10-CM | POA: Diagnosis not present

## 2019-02-02 DIAGNOSIS — I10 Essential (primary) hypertension: Secondary | ICD-10-CM | POA: Diagnosis not present

## 2019-02-08 DIAGNOSIS — I2510 Atherosclerotic heart disease of native coronary artery without angina pectoris: Secondary | ICD-10-CM | POA: Diagnosis not present

## 2019-02-08 DIAGNOSIS — M109 Gout, unspecified: Secondary | ICD-10-CM | POA: Diagnosis not present

## 2019-02-08 DIAGNOSIS — E782 Mixed hyperlipidemia: Secondary | ICD-10-CM | POA: Diagnosis not present

## 2019-02-08 DIAGNOSIS — I739 Peripheral vascular disease, unspecified: Secondary | ICD-10-CM | POA: Diagnosis not present

## 2019-02-08 DIAGNOSIS — I10 Essential (primary) hypertension: Secondary | ICD-10-CM | POA: Diagnosis not present

## 2019-02-08 DIAGNOSIS — J449 Chronic obstructive pulmonary disease, unspecified: Secondary | ICD-10-CM | POA: Diagnosis not present

## 2019-02-08 DIAGNOSIS — G4700 Insomnia, unspecified: Secondary | ICD-10-CM | POA: Diagnosis not present

## 2019-02-08 DIAGNOSIS — Z8679 Personal history of other diseases of the circulatory system: Secondary | ICD-10-CM | POA: Diagnosis not present

## 2019-02-08 DIAGNOSIS — E1169 Type 2 diabetes mellitus with other specified complication: Secondary | ICD-10-CM | POA: Diagnosis not present

## 2019-02-18 DIAGNOSIS — I739 Peripheral vascular disease, unspecified: Secondary | ICD-10-CM | POA: Diagnosis not present

## 2019-02-18 DIAGNOSIS — E782 Mixed hyperlipidemia: Secondary | ICD-10-CM | POA: Diagnosis not present

## 2019-02-18 DIAGNOSIS — Z8679 Personal history of other diseases of the circulatory system: Secondary | ICD-10-CM | POA: Diagnosis not present

## 2019-02-18 DIAGNOSIS — I10 Essential (primary) hypertension: Secondary | ICD-10-CM | POA: Diagnosis not present

## 2019-02-18 DIAGNOSIS — I714 Abdominal aortic aneurysm, without rupture: Secondary | ICD-10-CM | POA: Diagnosis not present

## 2019-02-18 DIAGNOSIS — Z6824 Body mass index (BMI) 24.0-24.9, adult: Secondary | ICD-10-CM | POA: Diagnosis not present

## 2019-02-18 DIAGNOSIS — I2510 Atherosclerotic heart disease of native coronary artery without angina pectoris: Secondary | ICD-10-CM | POA: Diagnosis not present

## 2019-03-18 DIAGNOSIS — I739 Peripheral vascular disease, unspecified: Secondary | ICD-10-CM | POA: Diagnosis not present

## 2019-03-18 DIAGNOSIS — R69 Illness, unspecified: Secondary | ICD-10-CM | POA: Diagnosis not present

## 2019-03-18 DIAGNOSIS — Z23 Encounter for immunization: Secondary | ICD-10-CM | POA: Diagnosis not present

## 2019-03-18 DIAGNOSIS — M109 Gout, unspecified: Secondary | ICD-10-CM | POA: Diagnosis not present

## 2019-03-18 DIAGNOSIS — E1169 Type 2 diabetes mellitus with other specified complication: Secondary | ICD-10-CM | POA: Diagnosis not present

## 2019-03-18 DIAGNOSIS — I10 Essential (primary) hypertension: Secondary | ICD-10-CM | POA: Diagnosis not present

## 2019-03-18 DIAGNOSIS — J449 Chronic obstructive pulmonary disease, unspecified: Secondary | ICD-10-CM | POA: Diagnosis not present

## 2019-03-18 DIAGNOSIS — Z6822 Body mass index (BMI) 22.0-22.9, adult: Secondary | ICD-10-CM | POA: Diagnosis not present

## 2019-03-18 DIAGNOSIS — E782 Mixed hyperlipidemia: Secondary | ICD-10-CM | POA: Diagnosis not present

## 2019-03-18 DIAGNOSIS — I2510 Atherosclerotic heart disease of native coronary artery without angina pectoris: Secondary | ICD-10-CM | POA: Diagnosis not present

## 2019-04-06 DIAGNOSIS — I2510 Atherosclerotic heart disease of native coronary artery without angina pectoris: Secondary | ICD-10-CM | POA: Diagnosis not present

## 2019-04-06 DIAGNOSIS — R69 Illness, unspecified: Secondary | ICD-10-CM | POA: Diagnosis not present

## 2019-04-06 DIAGNOSIS — Z720 Tobacco use: Secondary | ICD-10-CM | POA: Diagnosis not present

## 2019-04-06 DIAGNOSIS — J449 Chronic obstructive pulmonary disease, unspecified: Secondary | ICD-10-CM | POA: Diagnosis not present

## 2019-04-06 DIAGNOSIS — R945 Abnormal results of liver function studies: Secondary | ICD-10-CM | POA: Diagnosis not present

## 2019-04-06 DIAGNOSIS — E1169 Type 2 diabetes mellitus with other specified complication: Secondary | ICD-10-CM | POA: Diagnosis not present

## 2019-04-06 DIAGNOSIS — E782 Mixed hyperlipidemia: Secondary | ICD-10-CM | POA: Diagnosis not present

## 2019-04-06 DIAGNOSIS — M109 Gout, unspecified: Secondary | ICD-10-CM | POA: Diagnosis not present

## 2019-04-06 DIAGNOSIS — I739 Peripheral vascular disease, unspecified: Secondary | ICD-10-CM | POA: Diagnosis not present

## 2019-04-06 DIAGNOSIS — I10 Essential (primary) hypertension: Secondary | ICD-10-CM | POA: Diagnosis not present

## 2019-05-07 DIAGNOSIS — E119 Type 2 diabetes mellitus without complications: Secondary | ICD-10-CM | POA: Diagnosis not present

## 2019-05-07 DIAGNOSIS — E1169 Type 2 diabetes mellitus with other specified complication: Secondary | ICD-10-CM | POA: Diagnosis not present

## 2019-05-07 DIAGNOSIS — I10 Essential (primary) hypertension: Secondary | ICD-10-CM | POA: Diagnosis not present

## 2019-05-07 DIAGNOSIS — E782 Mixed hyperlipidemia: Secondary | ICD-10-CM | POA: Diagnosis not present

## 2019-05-10 DIAGNOSIS — M109 Gout, unspecified: Secondary | ICD-10-CM | POA: Diagnosis not present

## 2019-05-10 DIAGNOSIS — G4700 Insomnia, unspecified: Secondary | ICD-10-CM | POA: Diagnosis not present

## 2019-05-10 DIAGNOSIS — E782 Mixed hyperlipidemia: Secondary | ICD-10-CM | POA: Diagnosis not present

## 2019-05-10 DIAGNOSIS — I2510 Atherosclerotic heart disease of native coronary artery without angina pectoris: Secondary | ICD-10-CM | POA: Diagnosis not present

## 2019-05-10 DIAGNOSIS — E1169 Type 2 diabetes mellitus with other specified complication: Secondary | ICD-10-CM | POA: Diagnosis not present

## 2019-05-10 DIAGNOSIS — I739 Peripheral vascular disease, unspecified: Secondary | ICD-10-CM | POA: Diagnosis not present

## 2019-05-10 DIAGNOSIS — J449 Chronic obstructive pulmonary disease, unspecified: Secondary | ICD-10-CM | POA: Diagnosis not present

## 2019-05-10 DIAGNOSIS — R945 Abnormal results of liver function studies: Secondary | ICD-10-CM | POA: Diagnosis not present

## 2019-05-10 DIAGNOSIS — N183 Chronic kidney disease, stage 3 unspecified: Secondary | ICD-10-CM | POA: Diagnosis not present

## 2019-05-10 DIAGNOSIS — I10 Essential (primary) hypertension: Secondary | ICD-10-CM | POA: Diagnosis not present

## 2019-05-20 DIAGNOSIS — Z23 Encounter for immunization: Secondary | ICD-10-CM | POA: Diagnosis not present

## 2019-06-21 DIAGNOSIS — E119 Type 2 diabetes mellitus without complications: Secondary | ICD-10-CM | POA: Diagnosis not present

## 2019-06-21 DIAGNOSIS — I10 Essential (primary) hypertension: Secondary | ICD-10-CM | POA: Diagnosis not present

## 2019-06-21 DIAGNOSIS — I2510 Atherosclerotic heart disease of native coronary artery without angina pectoris: Secondary | ICD-10-CM | POA: Diagnosis not present

## 2019-06-21 DIAGNOSIS — I739 Peripheral vascular disease, unspecified: Secondary | ICD-10-CM | POA: Diagnosis not present

## 2019-06-21 DIAGNOSIS — E1169 Type 2 diabetes mellitus with other specified complication: Secondary | ICD-10-CM | POA: Diagnosis not present

## 2019-06-21 DIAGNOSIS — E782 Mixed hyperlipidemia: Secondary | ICD-10-CM | POA: Diagnosis not present

## 2019-06-21 DIAGNOSIS — J449 Chronic obstructive pulmonary disease, unspecified: Secondary | ICD-10-CM | POA: Diagnosis not present

## 2019-06-21 DIAGNOSIS — I519 Heart disease, unspecified: Secondary | ICD-10-CM | POA: Diagnosis not present

## 2019-06-21 DIAGNOSIS — M109 Gout, unspecified: Secondary | ICD-10-CM | POA: Diagnosis not present

## 2019-06-21 DIAGNOSIS — I714 Abdominal aortic aneurysm, without rupture: Secondary | ICD-10-CM | POA: Diagnosis not present

## 2019-09-28 DIAGNOSIS — I10 Essential (primary) hypertension: Secondary | ICD-10-CM | POA: Diagnosis not present

## 2019-09-28 DIAGNOSIS — Z7902 Long term (current) use of antithrombotics/antiplatelets: Secondary | ICD-10-CM | POA: Diagnosis not present

## 2019-09-28 DIAGNOSIS — Z7984 Long term (current) use of oral hypoglycemic drugs: Secondary | ICD-10-CM | POA: Diagnosis not present

## 2019-09-28 DIAGNOSIS — M25552 Pain in left hip: Secondary | ICD-10-CM | POA: Diagnosis not present

## 2019-09-28 DIAGNOSIS — Z8679 Personal history of other diseases of the circulatory system: Secondary | ICD-10-CM | POA: Diagnosis not present

## 2019-09-28 DIAGNOSIS — E1151 Type 2 diabetes mellitus with diabetic peripheral angiopathy without gangrene: Secondary | ICD-10-CM | POA: Diagnosis not present

## 2019-09-28 DIAGNOSIS — I714 Abdominal aortic aneurysm, without rupture: Secondary | ICD-10-CM | POA: Diagnosis not present

## 2019-09-28 DIAGNOSIS — Z95828 Presence of other vascular implants and grafts: Secondary | ICD-10-CM | POA: Diagnosis not present

## 2019-09-28 DIAGNOSIS — Z79899 Other long term (current) drug therapy: Secondary | ICD-10-CM | POA: Diagnosis not present

## 2019-09-28 DIAGNOSIS — Z7982 Long term (current) use of aspirin: Secondary | ICD-10-CM | POA: Diagnosis not present

## 2019-09-28 DIAGNOSIS — I2510 Atherosclerotic heart disease of native coronary artery without angina pectoris: Secondary | ICD-10-CM | POA: Diagnosis not present

## 2019-09-28 DIAGNOSIS — I739 Peripheral vascular disease, unspecified: Secondary | ICD-10-CM | POA: Diagnosis not present

## 2019-09-28 DIAGNOSIS — Z6823 Body mass index (BMI) 23.0-23.9, adult: Secondary | ICD-10-CM | POA: Diagnosis not present

## 2019-09-28 DIAGNOSIS — I252 Old myocardial infarction: Secondary | ICD-10-CM | POA: Diagnosis not present

## 2019-10-05 DIAGNOSIS — I2510 Atherosclerotic heart disease of native coronary artery without angina pectoris: Secondary | ICD-10-CM | POA: Diagnosis not present

## 2019-10-05 DIAGNOSIS — I10 Essential (primary) hypertension: Secondary | ICD-10-CM | POA: Diagnosis not present

## 2019-10-05 DIAGNOSIS — Z23 Encounter for immunization: Secondary | ICD-10-CM | POA: Diagnosis not present

## 2019-10-05 DIAGNOSIS — J449 Chronic obstructive pulmonary disease, unspecified: Secondary | ICD-10-CM | POA: Diagnosis not present

## 2019-10-05 DIAGNOSIS — E782 Mixed hyperlipidemia: Secondary | ICD-10-CM | POA: Diagnosis not present

## 2019-11-10 DIAGNOSIS — E1169 Type 2 diabetes mellitus with other specified complication: Secondary | ICD-10-CM | POA: Diagnosis not present

## 2019-11-10 DIAGNOSIS — E782 Mixed hyperlipidemia: Secondary | ICD-10-CM | POA: Diagnosis not present

## 2019-11-10 DIAGNOSIS — F5101 Primary insomnia: Secondary | ICD-10-CM | POA: Diagnosis not present

## 2019-11-10 DIAGNOSIS — E119 Type 2 diabetes mellitus without complications: Secondary | ICD-10-CM | POA: Diagnosis not present

## 2019-11-16 DIAGNOSIS — E782 Mixed hyperlipidemia: Secondary | ICD-10-CM | POA: Diagnosis not present

## 2019-11-16 DIAGNOSIS — Z0001 Encounter for general adult medical examination with abnormal findings: Secondary | ICD-10-CM | POA: Diagnosis not present

## 2019-12-10 DIAGNOSIS — E1122 Type 2 diabetes mellitus with diabetic chronic kidney disease: Secondary | ICD-10-CM | POA: Diagnosis not present

## 2019-12-10 DIAGNOSIS — I10 Essential (primary) hypertension: Secondary | ICD-10-CM | POA: Diagnosis not present

## 2019-12-10 DIAGNOSIS — N1831 Chronic kidney disease, stage 3a: Secondary | ICD-10-CM | POA: Diagnosis not present

## 2019-12-10 DIAGNOSIS — I129 Hypertensive chronic kidney disease with stage 1 through stage 4 chronic kidney disease, or unspecified chronic kidney disease: Secondary | ICD-10-CM | POA: Diagnosis not present

## 2019-12-10 DIAGNOSIS — E782 Mixed hyperlipidemia: Secondary | ICD-10-CM | POA: Diagnosis not present

## 2019-12-23 DIAGNOSIS — I255 Ischemic cardiomyopathy: Secondary | ICD-10-CM | POA: Diagnosis not present

## 2019-12-23 DIAGNOSIS — Z955 Presence of coronary angioplasty implant and graft: Secondary | ICD-10-CM | POA: Diagnosis not present

## 2019-12-23 DIAGNOSIS — Z951 Presence of aortocoronary bypass graft: Secondary | ICD-10-CM | POA: Diagnosis not present

## 2019-12-23 DIAGNOSIS — I10 Essential (primary) hypertension: Secondary | ICD-10-CM | POA: Diagnosis not present

## 2019-12-23 DIAGNOSIS — I2510 Atherosclerotic heart disease of native coronary artery without angina pectoris: Secondary | ICD-10-CM | POA: Diagnosis not present

## 2020-02-03 DIAGNOSIS — I129 Hypertensive chronic kidney disease with stage 1 through stage 4 chronic kidney disease, or unspecified chronic kidney disease: Secondary | ICD-10-CM | POA: Diagnosis not present

## 2020-02-03 DIAGNOSIS — N1831 Chronic kidney disease, stage 3a: Secondary | ICD-10-CM | POA: Diagnosis not present

## 2020-02-03 DIAGNOSIS — E1122 Type 2 diabetes mellitus with diabetic chronic kidney disease: Secondary | ICD-10-CM | POA: Diagnosis not present

## 2020-02-03 DIAGNOSIS — I10 Essential (primary) hypertension: Secondary | ICD-10-CM | POA: Diagnosis not present

## 2020-02-03 DIAGNOSIS — E782 Mixed hyperlipidemia: Secondary | ICD-10-CM | POA: Diagnosis not present

## 2020-02-23 DIAGNOSIS — I10 Essential (primary) hypertension: Secondary | ICD-10-CM | POA: Diagnosis not present

## 2020-02-23 DIAGNOSIS — E1169 Type 2 diabetes mellitus with other specified complication: Secondary | ICD-10-CM | POA: Diagnosis not present

## 2020-02-23 DIAGNOSIS — E1122 Type 2 diabetes mellitus with diabetic chronic kidney disease: Secondary | ICD-10-CM | POA: Diagnosis not present

## 2020-02-23 DIAGNOSIS — N183 Chronic kidney disease, stage 3 unspecified: Secondary | ICD-10-CM | POA: Diagnosis not present

## 2020-02-23 DIAGNOSIS — E782 Mixed hyperlipidemia: Secondary | ICD-10-CM | POA: Diagnosis not present

## 2020-02-29 DIAGNOSIS — I2510 Atherosclerotic heart disease of native coronary artery without angina pectoris: Secondary | ICD-10-CM | POA: Diagnosis not present

## 2020-02-29 DIAGNOSIS — E782 Mixed hyperlipidemia: Secondary | ICD-10-CM | POA: Diagnosis not present

## 2020-02-29 DIAGNOSIS — I10 Essential (primary) hypertension: Secondary | ICD-10-CM | POA: Diagnosis not present

## 2020-03-13 DIAGNOSIS — E782 Mixed hyperlipidemia: Secondary | ICD-10-CM | POA: Diagnosis not present

## 2020-03-13 DIAGNOSIS — Z23 Encounter for immunization: Secondary | ICD-10-CM | POA: Diagnosis not present

## 2020-03-13 DIAGNOSIS — I10 Essential (primary) hypertension: Secondary | ICD-10-CM | POA: Diagnosis not present

## 2020-03-13 DIAGNOSIS — I739 Peripheral vascular disease, unspecified: Secondary | ICD-10-CM | POA: Diagnosis not present

## 2020-03-13 DIAGNOSIS — E1169 Type 2 diabetes mellitus with other specified complication: Secondary | ICD-10-CM | POA: Diagnosis not present

## 2020-05-25 DIAGNOSIS — I10 Essential (primary) hypertension: Secondary | ICD-10-CM | POA: Diagnosis not present

## 2020-05-25 DIAGNOSIS — Z6822 Body mass index (BMI) 22.0-22.9, adult: Secondary | ICD-10-CM | POA: Diagnosis not present

## 2020-05-25 DIAGNOSIS — I219 Acute myocardial infarction, unspecified: Secondary | ICD-10-CM | POA: Diagnosis not present

## 2020-06-21 DIAGNOSIS — I10 Essential (primary) hypertension: Secondary | ICD-10-CM | POA: Diagnosis not present

## 2020-06-21 DIAGNOSIS — Z951 Presence of aortocoronary bypass graft: Secondary | ICD-10-CM | POA: Diagnosis not present

## 2020-06-21 DIAGNOSIS — Q219 Congenital malformation of cardiac septum, unspecified: Secondary | ICD-10-CM | POA: Diagnosis not present

## 2020-06-21 DIAGNOSIS — I052 Rheumatic mitral stenosis with insufficiency: Secondary | ICD-10-CM | POA: Diagnosis not present

## 2020-06-21 DIAGNOSIS — R079 Chest pain, unspecified: Secondary | ICD-10-CM | POA: Diagnosis not present

## 2020-06-21 DIAGNOSIS — R9439 Abnormal result of other cardiovascular function study: Secondary | ICD-10-CM | POA: Diagnosis not present

## 2020-06-21 DIAGNOSIS — I050 Rheumatic mitral stenosis: Secondary | ICD-10-CM | POA: Diagnosis not present

## 2020-06-21 DIAGNOSIS — I219 Acute myocardial infarction, unspecified: Secondary | ICD-10-CM | POA: Diagnosis not present

## 2020-08-03 DIAGNOSIS — Z23 Encounter for immunization: Secondary | ICD-10-CM | POA: Diagnosis not present

## 2020-08-03 DIAGNOSIS — E1169 Type 2 diabetes mellitus with other specified complication: Secondary | ICD-10-CM | POA: Diagnosis not present

## 2020-08-03 DIAGNOSIS — Z6822 Body mass index (BMI) 22.0-22.9, adult: Secondary | ICD-10-CM | POA: Diagnosis not present

## 2020-08-03 DIAGNOSIS — F17200 Nicotine dependence, unspecified, uncomplicated: Secondary | ICD-10-CM | POA: Diagnosis not present

## 2020-08-03 DIAGNOSIS — I129 Hypertensive chronic kidney disease with stage 1 through stage 4 chronic kidney disease, or unspecified chronic kidney disease: Secondary | ICD-10-CM | POA: Diagnosis not present

## 2020-08-07 DIAGNOSIS — F17200 Nicotine dependence, unspecified, uncomplicated: Secondary | ICD-10-CM | POA: Diagnosis not present

## 2020-08-07 DIAGNOSIS — G4700 Insomnia, unspecified: Secondary | ICD-10-CM | POA: Diagnosis not present

## 2020-08-07 DIAGNOSIS — R945 Abnormal results of liver function studies: Secondary | ICD-10-CM | POA: Diagnosis not present

## 2020-08-07 DIAGNOSIS — J449 Chronic obstructive pulmonary disease, unspecified: Secondary | ICD-10-CM | POA: Diagnosis not present

## 2020-08-07 DIAGNOSIS — N183 Chronic kidney disease, stage 3 unspecified: Secondary | ICD-10-CM | POA: Diagnosis not present

## 2020-08-07 DIAGNOSIS — I2510 Atherosclerotic heart disease of native coronary artery without angina pectoris: Secondary | ICD-10-CM | POA: Diagnosis not present

## 2020-08-07 DIAGNOSIS — E782 Mixed hyperlipidemia: Secondary | ICD-10-CM | POA: Diagnosis not present

## 2020-08-07 DIAGNOSIS — M109 Gout, unspecified: Secondary | ICD-10-CM | POA: Diagnosis not present

## 2020-08-07 DIAGNOSIS — N1831 Chronic kidney disease, stage 3a: Secondary | ICD-10-CM | POA: Diagnosis not present

## 2020-08-07 DIAGNOSIS — I10 Essential (primary) hypertension: Secondary | ICD-10-CM | POA: Diagnosis not present

## 2020-08-07 DIAGNOSIS — I739 Peripheral vascular disease, unspecified: Secondary | ICD-10-CM | POA: Diagnosis not present

## 2020-08-10 DIAGNOSIS — E119 Type 2 diabetes mellitus without complications: Secondary | ICD-10-CM | POA: Diagnosis not present

## 2020-08-18 DIAGNOSIS — Z7984 Long term (current) use of oral hypoglycemic drugs: Secondary | ICD-10-CM | POA: Diagnosis not present

## 2020-08-18 DIAGNOSIS — I5022 Chronic systolic (congestive) heart failure: Secondary | ICD-10-CM | POA: Diagnosis not present

## 2020-08-18 DIAGNOSIS — Z7982 Long term (current) use of aspirin: Secondary | ICD-10-CM | POA: Diagnosis not present

## 2020-08-18 DIAGNOSIS — Z952 Presence of prosthetic heart valve: Secondary | ICD-10-CM | POA: Diagnosis not present

## 2020-08-18 DIAGNOSIS — R9439 Abnormal result of other cardiovascular function study: Secondary | ICD-10-CM | POA: Diagnosis not present

## 2020-08-18 DIAGNOSIS — E119 Type 2 diabetes mellitus without complications: Secondary | ICD-10-CM | POA: Diagnosis not present

## 2020-08-18 DIAGNOSIS — I110 Hypertensive heart disease with heart failure: Secondary | ICD-10-CM | POA: Diagnosis not present

## 2020-08-18 DIAGNOSIS — R001 Bradycardia, unspecified: Secondary | ICD-10-CM | POA: Diagnosis not present

## 2020-08-18 DIAGNOSIS — E785 Hyperlipidemia, unspecified: Secondary | ICD-10-CM | POA: Diagnosis not present

## 2020-08-18 DIAGNOSIS — Z951 Presence of aortocoronary bypass graft: Secondary | ICD-10-CM | POA: Diagnosis not present

## 2020-08-18 DIAGNOSIS — I252 Old myocardial infarction: Secondary | ICD-10-CM | POA: Diagnosis not present

## 2020-08-18 DIAGNOSIS — I255 Ischemic cardiomyopathy: Secondary | ICD-10-CM | POA: Diagnosis not present

## 2020-08-18 DIAGNOSIS — Z79899 Other long term (current) drug therapy: Secondary | ICD-10-CM | POA: Diagnosis not present

## 2020-08-18 DIAGNOSIS — R9431 Abnormal electrocardiogram [ECG] [EKG]: Secondary | ICD-10-CM | POA: Diagnosis not present

## 2020-08-18 DIAGNOSIS — F17210 Nicotine dependence, cigarettes, uncomplicated: Secondary | ICD-10-CM | POA: Diagnosis not present

## 2020-08-18 DIAGNOSIS — Z885 Allergy status to narcotic agent status: Secondary | ICD-10-CM | POA: Diagnosis not present

## 2020-08-18 DIAGNOSIS — Z955 Presence of coronary angioplasty implant and graft: Secondary | ICD-10-CM | POA: Diagnosis not present

## 2020-08-18 DIAGNOSIS — I25810 Atherosclerosis of coronary artery bypass graft(s) without angina pectoris: Secondary | ICD-10-CM | POA: Diagnosis not present

## 2020-08-18 DIAGNOSIS — I4589 Other specified conduction disorders: Secondary | ICD-10-CM | POA: Diagnosis not present

## 2020-08-18 DIAGNOSIS — I25118 Atherosclerotic heart disease of native coronary artery with other forms of angina pectoris: Secondary | ICD-10-CM | POA: Diagnosis not present

## 2020-08-18 DIAGNOSIS — Z9049 Acquired absence of other specified parts of digestive tract: Secondary | ICD-10-CM | POA: Diagnosis not present

## 2020-08-18 DIAGNOSIS — I440 Atrioventricular block, first degree: Secondary | ICD-10-CM | POA: Diagnosis not present

## 2020-08-18 DIAGNOSIS — I714 Abdominal aortic aneurysm, without rupture: Secondary | ICD-10-CM | POA: Diagnosis not present

## 2020-09-25 DIAGNOSIS — R945 Abnormal results of liver function studies: Secondary | ICD-10-CM | POA: Diagnosis not present

## 2020-09-25 DIAGNOSIS — I714 Abdominal aortic aneurysm, without rupture: Secondary | ICD-10-CM | POA: Diagnosis not present

## 2020-09-25 DIAGNOSIS — I10 Essential (primary) hypertension: Secondary | ICD-10-CM | POA: Diagnosis not present

## 2020-09-25 DIAGNOSIS — Z720 Tobacco use: Secondary | ICD-10-CM | POA: Diagnosis not present

## 2020-09-25 DIAGNOSIS — E1169 Type 2 diabetes mellitus with other specified complication: Secondary | ICD-10-CM | POA: Diagnosis not present

## 2020-09-25 DIAGNOSIS — I519 Heart disease, unspecified: Secondary | ICD-10-CM | POA: Diagnosis not present

## 2020-09-25 DIAGNOSIS — J449 Chronic obstructive pulmonary disease, unspecified: Secondary | ICD-10-CM | POA: Diagnosis not present

## 2020-09-25 DIAGNOSIS — E782 Mixed hyperlipidemia: Secondary | ICD-10-CM | POA: Diagnosis not present

## 2020-09-25 DIAGNOSIS — F5101 Primary insomnia: Secondary | ICD-10-CM | POA: Diagnosis not present

## 2020-09-25 DIAGNOSIS — E119 Type 2 diabetes mellitus without complications: Secondary | ICD-10-CM | POA: Diagnosis not present

## 2020-09-25 DIAGNOSIS — M109 Gout, unspecified: Secondary | ICD-10-CM | POA: Diagnosis not present

## 2020-09-25 DIAGNOSIS — I2510 Atherosclerotic heart disease of native coronary artery without angina pectoris: Secondary | ICD-10-CM | POA: Diagnosis not present

## 2020-10-12 DIAGNOSIS — I219 Acute myocardial infarction, unspecified: Secondary | ICD-10-CM | POA: Diagnosis not present

## 2020-10-12 DIAGNOSIS — Z6822 Body mass index (BMI) 22.0-22.9, adult: Secondary | ICD-10-CM | POA: Diagnosis not present

## 2020-10-25 DIAGNOSIS — E782 Mixed hyperlipidemia: Secondary | ICD-10-CM | POA: Diagnosis not present

## 2020-10-25 DIAGNOSIS — Z720 Tobacco use: Secondary | ICD-10-CM | POA: Diagnosis not present

## 2020-10-25 DIAGNOSIS — I2510 Atherosclerotic heart disease of native coronary artery without angina pectoris: Secondary | ICD-10-CM | POA: Diagnosis not present

## 2020-10-25 DIAGNOSIS — E119 Type 2 diabetes mellitus without complications: Secondary | ICD-10-CM | POA: Diagnosis not present

## 2020-10-25 DIAGNOSIS — R945 Abnormal results of liver function studies: Secondary | ICD-10-CM | POA: Diagnosis not present

## 2020-10-25 DIAGNOSIS — F5101 Primary insomnia: Secondary | ICD-10-CM | POA: Diagnosis not present

## 2020-10-25 DIAGNOSIS — E1169 Type 2 diabetes mellitus with other specified complication: Secondary | ICD-10-CM | POA: Diagnosis not present

## 2020-10-25 DIAGNOSIS — M109 Gout, unspecified: Secondary | ICD-10-CM | POA: Diagnosis not present

## 2020-10-25 DIAGNOSIS — I519 Heart disease, unspecified: Secondary | ICD-10-CM | POA: Diagnosis not present

## 2020-10-25 DIAGNOSIS — I10 Essential (primary) hypertension: Secondary | ICD-10-CM | POA: Diagnosis not present

## 2020-10-25 DIAGNOSIS — I714 Abdominal aortic aneurysm, without rupture: Secondary | ICD-10-CM | POA: Diagnosis not present

## 2020-10-27 HISTORY — PX: ABDOMINAL AORTIC ANEURYSM REPAIR: SHX42

## 2020-11-28 DIAGNOSIS — E119 Type 2 diabetes mellitus without complications: Secondary | ICD-10-CM | POA: Diagnosis not present

## 2020-11-28 DIAGNOSIS — Z7982 Long term (current) use of aspirin: Secondary | ICD-10-CM | POA: Diagnosis not present

## 2020-11-28 DIAGNOSIS — Z6822 Body mass index (BMI) 22.0-22.9, adult: Secondary | ICD-10-CM | POA: Diagnosis not present

## 2020-11-28 DIAGNOSIS — Z95828 Presence of other vascular implants and grafts: Secondary | ICD-10-CM | POA: Diagnosis not present

## 2020-11-28 DIAGNOSIS — I10 Essential (primary) hypertension: Secondary | ICD-10-CM | POA: Diagnosis not present

## 2020-11-28 DIAGNOSIS — I723 Aneurysm of iliac artery: Secondary | ICD-10-CM | POA: Diagnosis not present

## 2020-11-28 DIAGNOSIS — I739 Peripheral vascular disease, unspecified: Secondary | ICD-10-CM | POA: Diagnosis not present

## 2020-11-28 DIAGNOSIS — Z951 Presence of aortocoronary bypass graft: Secondary | ICD-10-CM | POA: Diagnosis not present

## 2020-11-28 DIAGNOSIS — I714 Abdominal aortic aneurysm, without rupture: Secondary | ICD-10-CM | POA: Diagnosis not present

## 2020-11-28 DIAGNOSIS — Z8679 Personal history of other diseases of the circulatory system: Secondary | ICD-10-CM | POA: Diagnosis not present

## 2020-11-28 DIAGNOSIS — Z7902 Long term (current) use of antithrombotics/antiplatelets: Secondary | ICD-10-CM | POA: Diagnosis not present

## 2020-11-28 DIAGNOSIS — Z7984 Long term (current) use of oral hypoglycemic drugs: Secondary | ICD-10-CM | POA: Diagnosis not present

## 2020-11-28 DIAGNOSIS — Z79899 Other long term (current) drug therapy: Secondary | ICD-10-CM | POA: Diagnosis not present

## 2020-11-28 DIAGNOSIS — I252 Old myocardial infarction: Secondary | ICD-10-CM | POA: Diagnosis not present

## 2020-11-28 DIAGNOSIS — I2510 Atherosclerotic heart disease of native coronary artery without angina pectoris: Secondary | ICD-10-CM | POA: Diagnosis not present

## 2020-11-28 DIAGNOSIS — E1151 Type 2 diabetes mellitus with diabetic peripheral angiopathy without gangrene: Secondary | ICD-10-CM | POA: Diagnosis not present

## 2020-11-28 DIAGNOSIS — Z955 Presence of coronary angioplasty implant and graft: Secondary | ICD-10-CM | POA: Diagnosis not present

## 2020-11-28 DIAGNOSIS — F17210 Nicotine dependence, cigarettes, uncomplicated: Secondary | ICD-10-CM | POA: Diagnosis not present

## 2020-11-28 DIAGNOSIS — E785 Hyperlipidemia, unspecified: Secondary | ICD-10-CM | POA: Diagnosis not present

## 2020-11-28 DIAGNOSIS — I722 Aneurysm of renal artery: Secondary | ICD-10-CM | POA: Diagnosis not present

## 2020-12-06 DIAGNOSIS — I10 Essential (primary) hypertension: Secondary | ICD-10-CM | POA: Diagnosis not present

## 2020-12-06 DIAGNOSIS — E1122 Type 2 diabetes mellitus with diabetic chronic kidney disease: Secondary | ICD-10-CM | POA: Diagnosis not present

## 2020-12-18 DIAGNOSIS — M109 Gout, unspecified: Secondary | ICD-10-CM | POA: Diagnosis not present

## 2020-12-18 DIAGNOSIS — F17201 Nicotine dependence, unspecified, in remission: Secondary | ICD-10-CM | POA: Diagnosis not present

## 2020-12-18 DIAGNOSIS — I2510 Atherosclerotic heart disease of native coronary artery without angina pectoris: Secondary | ICD-10-CM | POA: Diagnosis not present

## 2020-12-18 DIAGNOSIS — D509 Iron deficiency anemia, unspecified: Secondary | ICD-10-CM | POA: Diagnosis not present

## 2020-12-18 DIAGNOSIS — R809 Proteinuria, unspecified: Secondary | ICD-10-CM | POA: Diagnosis not present

## 2020-12-18 DIAGNOSIS — R7401 Elevation of levels of liver transaminase levels: Secondary | ICD-10-CM | POA: Diagnosis not present

## 2020-12-18 DIAGNOSIS — I10 Essential (primary) hypertension: Secondary | ICD-10-CM | POA: Diagnosis not present

## 2020-12-18 DIAGNOSIS — E1165 Type 2 diabetes mellitus with hyperglycemia: Secondary | ICD-10-CM | POA: Diagnosis not present

## 2020-12-18 DIAGNOSIS — N182 Chronic kidney disease, stage 2 (mild): Secondary | ICD-10-CM | POA: Diagnosis not present

## 2020-12-18 DIAGNOSIS — J441 Chronic obstructive pulmonary disease with (acute) exacerbation: Secondary | ICD-10-CM | POA: Diagnosis not present

## 2020-12-18 DIAGNOSIS — I714 Abdominal aortic aneurysm, without rupture: Secondary | ICD-10-CM | POA: Diagnosis not present

## 2021-01-10 DIAGNOSIS — D509 Iron deficiency anemia, unspecified: Secondary | ICD-10-CM | POA: Diagnosis not present

## 2021-01-10 DIAGNOSIS — I10 Essential (primary) hypertension: Secondary | ICD-10-CM | POA: Diagnosis not present

## 2021-01-10 DIAGNOSIS — E1165 Type 2 diabetes mellitus with hyperglycemia: Secondary | ICD-10-CM | POA: Diagnosis not present

## 2021-01-25 DIAGNOSIS — E1165 Type 2 diabetes mellitus with hyperglycemia: Secondary | ICD-10-CM | POA: Diagnosis not present

## 2021-01-25 DIAGNOSIS — I10 Essential (primary) hypertension: Secondary | ICD-10-CM | POA: Diagnosis not present

## 2021-03-13 DIAGNOSIS — E1169 Type 2 diabetes mellitus with other specified complication: Secondary | ICD-10-CM | POA: Diagnosis not present

## 2021-03-13 DIAGNOSIS — I10 Essential (primary) hypertension: Secondary | ICD-10-CM | POA: Diagnosis not present

## 2021-03-16 DIAGNOSIS — I714 Abdominal aortic aneurysm, without rupture: Secondary | ICD-10-CM | POA: Diagnosis not present

## 2021-03-16 DIAGNOSIS — E1165 Type 2 diabetes mellitus with hyperglycemia: Secondary | ICD-10-CM | POA: Diagnosis not present

## 2021-03-16 DIAGNOSIS — I10 Essential (primary) hypertension: Secondary | ICD-10-CM | POA: Diagnosis not present

## 2021-03-16 DIAGNOSIS — R809 Proteinuria, unspecified: Secondary | ICD-10-CM | POA: Diagnosis not present

## 2021-03-16 DIAGNOSIS — M109 Gout, unspecified: Secondary | ICD-10-CM | POA: Diagnosis not present

## 2021-03-16 DIAGNOSIS — R7401 Elevation of levels of liver transaminase levels: Secondary | ICD-10-CM | POA: Diagnosis not present

## 2021-03-16 DIAGNOSIS — F17201 Nicotine dependence, unspecified, in remission: Secondary | ICD-10-CM | POA: Diagnosis not present

## 2021-03-16 DIAGNOSIS — I2510 Atherosclerotic heart disease of native coronary artery without angina pectoris: Secondary | ICD-10-CM | POA: Diagnosis not present

## 2021-03-16 DIAGNOSIS — D509 Iron deficiency anemia, unspecified: Secondary | ICD-10-CM | POA: Diagnosis not present

## 2021-03-16 DIAGNOSIS — N182 Chronic kidney disease, stage 2 (mild): Secondary | ICD-10-CM | POA: Diagnosis not present

## 2021-03-16 DIAGNOSIS — J441 Chronic obstructive pulmonary disease with (acute) exacerbation: Secondary | ICD-10-CM | POA: Diagnosis not present

## 2021-03-28 DIAGNOSIS — E1165 Type 2 diabetes mellitus with hyperglycemia: Secondary | ICD-10-CM | POA: Diagnosis not present

## 2021-03-28 DIAGNOSIS — I10 Essential (primary) hypertension: Secondary | ICD-10-CM | POA: Diagnosis not present

## 2021-04-19 DIAGNOSIS — Z6822 Body mass index (BMI) 22.0-22.9, adult: Secondary | ICD-10-CM | POA: Diagnosis not present

## 2021-04-19 DIAGNOSIS — I2510 Atherosclerotic heart disease of native coronary artery without angina pectoris: Secondary | ICD-10-CM | POA: Diagnosis not present

## 2021-05-02 DIAGNOSIS — Z789 Other specified health status: Secondary | ICD-10-CM | POA: Diagnosis not present

## 2021-05-02 DIAGNOSIS — M79662 Pain in left lower leg: Secondary | ICD-10-CM | POA: Diagnosis not present

## 2021-05-02 DIAGNOSIS — Z888 Allergy status to other drugs, medicaments and biological substances status: Secondary | ICD-10-CM | POA: Diagnosis not present

## 2021-05-02 DIAGNOSIS — Z7409 Other reduced mobility: Secondary | ICD-10-CM | POA: Diagnosis not present

## 2021-05-02 DIAGNOSIS — Z87891 Personal history of nicotine dependence: Secondary | ICD-10-CM | POA: Diagnosis not present

## 2021-05-02 DIAGNOSIS — I10 Essential (primary) hypertension: Secondary | ICD-10-CM | POA: Diagnosis not present

## 2021-05-02 DIAGNOSIS — D649 Anemia, unspecified: Secondary | ICD-10-CM | POA: Diagnosis not present

## 2021-05-02 DIAGNOSIS — G4700 Insomnia, unspecified: Secondary | ICD-10-CM | POA: Diagnosis not present

## 2021-05-02 DIAGNOSIS — S72422A Displaced fracture of lateral condyle of left femur, initial encounter for closed fracture: Secondary | ICD-10-CM | POA: Diagnosis not present

## 2021-05-02 DIAGNOSIS — I5020 Unspecified systolic (congestive) heart failure: Secondary | ICD-10-CM | POA: Diagnosis not present

## 2021-05-02 DIAGNOSIS — S72402A Unspecified fracture of lower end of left femur, initial encounter for closed fracture: Secondary | ICD-10-CM | POA: Diagnosis not present

## 2021-05-02 DIAGNOSIS — E785 Hyperlipidemia, unspecified: Secondary | ICD-10-CM | POA: Diagnosis not present

## 2021-05-02 DIAGNOSIS — I255 Ischemic cardiomyopathy: Secondary | ICD-10-CM | POA: Diagnosis not present

## 2021-05-02 DIAGNOSIS — Z7982 Long term (current) use of aspirin: Secondary | ICD-10-CM | POA: Diagnosis not present

## 2021-05-02 DIAGNOSIS — I2510 Atherosclerotic heart disease of native coronary artery without angina pectoris: Secondary | ICD-10-CM | POA: Diagnosis not present

## 2021-05-02 DIAGNOSIS — F32A Depression, unspecified: Secondary | ICD-10-CM | POA: Diagnosis not present

## 2021-05-02 DIAGNOSIS — Z79899 Other long term (current) drug therapy: Secondary | ICD-10-CM | POA: Diagnosis not present

## 2021-05-02 DIAGNOSIS — I739 Peripheral vascular disease, unspecified: Secondary | ICD-10-CM | POA: Diagnosis not present

## 2021-05-02 DIAGNOSIS — Z01818 Encounter for other preprocedural examination: Secondary | ICD-10-CM | POA: Diagnosis not present

## 2021-05-02 DIAGNOSIS — W109XXA Fall (on) (from) unspecified stairs and steps, initial encounter: Secondary | ICD-10-CM | POA: Diagnosis not present

## 2021-05-02 DIAGNOSIS — Z885 Allergy status to narcotic agent status: Secondary | ICD-10-CM | POA: Diagnosis not present

## 2021-05-02 DIAGNOSIS — Z8679 Personal history of other diseases of the circulatory system: Secondary | ICD-10-CM | POA: Diagnosis not present

## 2021-05-02 DIAGNOSIS — Z043 Encounter for examination and observation following other accident: Secondary | ICD-10-CM | POA: Diagnosis not present

## 2021-05-02 DIAGNOSIS — Z7984 Long term (current) use of oral hypoglycemic drugs: Secondary | ICD-10-CM | POA: Diagnosis not present

## 2021-05-02 DIAGNOSIS — I110 Hypertensive heart disease with heart failure: Secondary | ICD-10-CM | POA: Diagnosis not present

## 2021-05-02 DIAGNOSIS — I5022 Chronic systolic (congestive) heart failure: Secondary | ICD-10-CM | POA: Diagnosis not present

## 2021-05-02 DIAGNOSIS — N179 Acute kidney failure, unspecified: Secondary | ICD-10-CM | POA: Diagnosis not present

## 2021-05-02 DIAGNOSIS — R001 Bradycardia, unspecified: Secondary | ICD-10-CM | POA: Diagnosis not present

## 2021-05-02 DIAGNOSIS — Z794 Long term (current) use of insulin: Secondary | ICD-10-CM | POA: Diagnosis not present

## 2021-05-02 DIAGNOSIS — E119 Type 2 diabetes mellitus without complications: Secondary | ICD-10-CM | POA: Diagnosis not present

## 2021-05-02 DIAGNOSIS — Z955 Presence of coronary angioplasty implant and graft: Secondary | ICD-10-CM | POA: Diagnosis not present

## 2021-05-02 DIAGNOSIS — E782 Mixed hyperlipidemia: Secondary | ICD-10-CM | POA: Diagnosis not present

## 2021-05-02 DIAGNOSIS — Z951 Presence of aortocoronary bypass graft: Secondary | ICD-10-CM | POA: Diagnosis not present

## 2021-05-02 DIAGNOSIS — W19XXXA Unspecified fall, initial encounter: Secondary | ICD-10-CM | POA: Diagnosis not present

## 2021-05-02 DIAGNOSIS — S72342A Displaced spiral fracture of shaft of left femur, initial encounter for closed fracture: Secondary | ICD-10-CM | POA: Diagnosis not present

## 2021-05-02 DIAGNOSIS — E1151 Type 2 diabetes mellitus with diabetic peripheral angiopathy without gangrene: Secondary | ICD-10-CM | POA: Diagnosis not present

## 2021-05-03 HISTORY — PX: FEMUR SURGERY: SHX943

## 2021-05-12 DIAGNOSIS — I2510 Atherosclerotic heart disease of native coronary artery without angina pectoris: Secondary | ICD-10-CM | POA: Diagnosis not present

## 2021-05-12 DIAGNOSIS — Z955 Presence of coronary angioplasty implant and graft: Secondary | ICD-10-CM | POA: Diagnosis not present

## 2021-05-12 DIAGNOSIS — S72332D Displaced oblique fracture of shaft of left femur, subsequent encounter for closed fracture with routine healing: Secondary | ICD-10-CM | POA: Diagnosis not present

## 2021-05-12 DIAGNOSIS — D649 Anemia, unspecified: Secondary | ICD-10-CM | POA: Diagnosis not present

## 2021-05-12 DIAGNOSIS — Z9181 History of falling: Secondary | ICD-10-CM | POA: Diagnosis not present

## 2021-05-12 DIAGNOSIS — E782 Mixed hyperlipidemia: Secondary | ICD-10-CM | POA: Diagnosis not present

## 2021-05-12 DIAGNOSIS — Z87891 Personal history of nicotine dependence: Secondary | ICD-10-CM | POA: Diagnosis not present

## 2021-05-12 DIAGNOSIS — E1151 Type 2 diabetes mellitus with diabetic peripheral angiopathy without gangrene: Secondary | ICD-10-CM | POA: Diagnosis not present

## 2021-05-12 DIAGNOSIS — Z7901 Long term (current) use of anticoagulants: Secondary | ICD-10-CM | POA: Diagnosis not present

## 2021-05-12 DIAGNOSIS — I10 Essential (primary) hypertension: Secondary | ICD-10-CM | POA: Diagnosis not present

## 2021-05-12 DIAGNOSIS — F32A Depression, unspecified: Secondary | ICD-10-CM | POA: Diagnosis not present

## 2021-05-16 DIAGNOSIS — I2510 Atherosclerotic heart disease of native coronary artery without angina pectoris: Secondary | ICD-10-CM | POA: Diagnosis not present

## 2021-05-16 DIAGNOSIS — S72332D Displaced oblique fracture of shaft of left femur, subsequent encounter for closed fracture with routine healing: Secondary | ICD-10-CM | POA: Diagnosis not present

## 2021-05-16 DIAGNOSIS — E782 Mixed hyperlipidemia: Secondary | ICD-10-CM | POA: Diagnosis not present

## 2021-05-16 DIAGNOSIS — F32A Depression, unspecified: Secondary | ICD-10-CM | POA: Diagnosis not present

## 2021-05-16 DIAGNOSIS — I10 Essential (primary) hypertension: Secondary | ICD-10-CM | POA: Diagnosis not present

## 2021-05-16 DIAGNOSIS — Z87891 Personal history of nicotine dependence: Secondary | ICD-10-CM | POA: Diagnosis not present

## 2021-05-16 DIAGNOSIS — Z9181 History of falling: Secondary | ICD-10-CM | POA: Diagnosis not present

## 2021-05-16 DIAGNOSIS — Z7901 Long term (current) use of anticoagulants: Secondary | ICD-10-CM | POA: Diagnosis not present

## 2021-05-16 DIAGNOSIS — E1151 Type 2 diabetes mellitus with diabetic peripheral angiopathy without gangrene: Secondary | ICD-10-CM | POA: Diagnosis not present

## 2021-05-16 DIAGNOSIS — D649 Anemia, unspecified: Secondary | ICD-10-CM | POA: Diagnosis not present

## 2021-05-16 DIAGNOSIS — Z955 Presence of coronary angioplasty implant and graft: Secondary | ICD-10-CM | POA: Diagnosis not present

## 2021-05-17 DIAGNOSIS — E782 Mixed hyperlipidemia: Secondary | ICD-10-CM | POA: Diagnosis not present

## 2021-05-17 DIAGNOSIS — E1140 Type 2 diabetes mellitus with diabetic neuropathy, unspecified: Secondary | ICD-10-CM | POA: Diagnosis not present

## 2021-05-17 DIAGNOSIS — N189 Chronic kidney disease, unspecified: Secondary | ICD-10-CM | POA: Diagnosis not present

## 2021-05-17 DIAGNOSIS — Z23 Encounter for immunization: Secondary | ICD-10-CM | POA: Diagnosis not present

## 2021-05-17 DIAGNOSIS — I10 Essential (primary) hypertension: Secondary | ICD-10-CM | POA: Diagnosis not present

## 2021-05-17 DIAGNOSIS — S7290XD Unspecified fracture of unspecified femur, subsequent encounter for closed fracture with routine healing: Secondary | ICD-10-CM | POA: Diagnosis not present

## 2021-05-17 DIAGNOSIS — E1165 Type 2 diabetes mellitus with hyperglycemia: Secondary | ICD-10-CM | POA: Diagnosis not present

## 2021-05-17 DIAGNOSIS — D649 Anemia, unspecified: Secondary | ICD-10-CM | POA: Diagnosis not present

## 2021-05-17 DIAGNOSIS — F17200 Nicotine dependence, unspecified, uncomplicated: Secondary | ICD-10-CM | POA: Diagnosis not present

## 2021-05-18 ENCOUNTER — Observation Stay (HOSPITAL_COMMUNITY): Payer: Medicare Other

## 2021-05-18 ENCOUNTER — Other Ambulatory Visit: Payer: Self-pay

## 2021-05-18 ENCOUNTER — Observation Stay (HOSPITAL_COMMUNITY)
Admission: EM | Admit: 2021-05-18 | Discharge: 2021-05-19 | Disposition: A | Discharge status: Home / Self Care | Payer: Medicare Other | Attending: Internal Medicine | Admitting: Internal Medicine

## 2021-05-18 ENCOUNTER — Encounter (HOSPITAL_COMMUNITY): Payer: Self-pay | Admitting: *Deleted

## 2021-05-18 DIAGNOSIS — K921 Melena: Secondary | ICD-10-CM | POA: Insufficient documentation

## 2021-05-18 DIAGNOSIS — D649 Anemia, unspecified: Secondary | ICD-10-CM

## 2021-05-18 DIAGNOSIS — Z951 Presence of aortocoronary bypass graft: Secondary | ICD-10-CM | POA: Insufficient documentation

## 2021-05-18 DIAGNOSIS — D62 Acute posthemorrhagic anemia: Secondary | ICD-10-CM | POA: Diagnosis not present

## 2021-05-18 DIAGNOSIS — I714 Abdominal aortic aneurysm, without rupture, unspecified: Secondary | ICD-10-CM | POA: Diagnosis not present

## 2021-05-18 DIAGNOSIS — I10 Essential (primary) hypertension: Secondary | ICD-10-CM | POA: Diagnosis not present

## 2021-05-18 DIAGNOSIS — I2510 Atherosclerotic heart disease of native coronary artery without angina pectoris: Secondary | ICD-10-CM | POA: Diagnosis not present

## 2021-05-18 DIAGNOSIS — Z955 Presence of coronary angioplasty implant and graft: Secondary | ICD-10-CM | POA: Diagnosis not present

## 2021-05-18 DIAGNOSIS — K922 Gastrointestinal hemorrhage, unspecified: Secondary | ICD-10-CM | POA: Diagnosis not present

## 2021-05-18 DIAGNOSIS — Z7984 Long term (current) use of oral hypoglycemic drugs: Secondary | ICD-10-CM | POA: Insufficient documentation

## 2021-05-18 DIAGNOSIS — E785 Hyperlipidemia, unspecified: Secondary | ICD-10-CM

## 2021-05-18 DIAGNOSIS — Z79899 Other long term (current) drug therapy: Secondary | ICD-10-CM | POA: Diagnosis not present

## 2021-05-18 DIAGNOSIS — E119 Type 2 diabetes mellitus without complications: Secondary | ICD-10-CM

## 2021-05-18 DIAGNOSIS — Z9181 History of falling: Secondary | ICD-10-CM | POA: Insufficient documentation

## 2021-05-18 DIAGNOSIS — F17200 Nicotine dependence, unspecified, uncomplicated: Secondary | ICD-10-CM | POA: Insufficient documentation

## 2021-05-18 DIAGNOSIS — Z20822 Contact with and (suspected) exposure to covid-19: Secondary | ICD-10-CM | POA: Insufficient documentation

## 2021-05-18 DIAGNOSIS — Z7982 Long term (current) use of aspirin: Secondary | ICD-10-CM | POA: Insufficient documentation

## 2021-05-18 DIAGNOSIS — F32A Depression, unspecified: Secondary | ICD-10-CM

## 2021-05-18 HISTORY — DX: Abdominal aortic aneurysm, without rupture, unspecified: I71.40

## 2021-05-18 HISTORY — DX: Hyperlipidemia, unspecified: E78.5

## 2021-05-18 HISTORY — DX: Depression, unspecified: F32.A

## 2021-05-18 HISTORY — DX: Peripheral vascular disease, unspecified: I73.9

## 2021-05-18 LAB — COMPREHENSIVE METABOLIC PANEL
ALT: 22 U/L (ref 0–44)
AST: 48 U/L — ABNORMAL HIGH (ref 15–41)
Albumin: 3.6 g/dL (ref 3.5–5.0)
Alkaline Phosphatase: 69 U/L (ref 38–126)
Anion gap: 8 (ref 5–15)
BUN: 24 mg/dL — ABNORMAL HIGH (ref 8–23)
CO2: 24 mmol/L (ref 22–32)
Calcium: 9.6 mg/dL (ref 8.9–10.3)
Chloride: 109 mmol/L (ref 98–111)
Creatinine, Ser: 1.04 mg/dL — ABNORMAL HIGH (ref 0.44–1.00)
GFR, Estimated: 57 mL/min — ABNORMAL LOW (ref >60)
Glucose, Bld: 190 mg/dL — ABNORMAL HIGH (ref 70–99)
Potassium: 3.8 mmol/L (ref 3.5–5.1)
Sodium: 141 mmol/L (ref 135–145)
Total Bilirubin: 0.8 mg/dL (ref 0.3–1.2)
Total Protein: 6.5 g/dL (ref 6.5–8.1)

## 2021-05-18 LAB — RESP PANEL BY RT-PCR (FLU A&B, COVID) ARPGX2
Influenza A by PCR: NEGATIVE
Influenza B by PCR: NEGATIVE
SARS Coronavirus 2 by RT PCR: NEGATIVE

## 2021-05-18 LAB — PREPARE RBC (CROSSMATCH)

## 2021-05-18 LAB — CBC
HCT: 20.4 % — ABNORMAL LOW (ref 36.0–46.0)
Hemoglobin: 5.9 g/dL — CL (ref 12.0–15.0)
MCH: 29.8 pg (ref 26.0–34.0)
MCHC: 28.9 g/dL — ABNORMAL LOW (ref 30.0–36.0)
MCV: 103 fL — ABNORMAL HIGH (ref 80.0–100.0)
Platelets: 353 10*3/uL (ref 150–400)
RBC: 1.98 MIL/uL — ABNORMAL LOW (ref 3.87–5.11)
RDW: 19.2 % — ABNORMAL HIGH (ref 11.5–15.5)
WBC: 7.8 10*3/uL (ref 4.0–10.5)
nRBC: 0.8 % — ABNORMAL HIGH (ref 0.0–0.2)

## 2021-05-18 LAB — ABO/RH: ABO/RH(D): A POS

## 2021-05-18 LAB — GLUCOSE, CAPILLARY: Glucose-Capillary: 89 mg/dL (ref 70–99)

## 2021-05-18 MED ORDER — SODIUM CHLORIDE 0.9 % IV SOLN: 250.0000 mL | INTRAVENOUS | Status: DC | PRN

## 2021-05-18 MED ORDER — ONDANSETRON HCL 4 MG PO TABS: 4.0000 mg | ORAL_TABLET | Freq: Four times a day (QID) | ORAL | Status: DC | PRN

## 2021-05-18 MED ORDER — SODIUM CHLORIDE 0.9 % IV SOLN: 10.0000 mL/h | Freq: Once | INTRAVENOUS | Status: DC

## 2021-05-18 MED ORDER — METOPROLOL TARTRATE 25 MG PO TABS
12.5000 mg | ORAL_TABLET | Freq: Two times a day (BID) | ORAL | Status: DC
  Administered 2021-05-18 – 2021-05-19 (×2): 12.5 mg via ORAL
  Filled 2021-05-18 (×2): qty 1

## 2021-05-18 MED ORDER — ACETAMINOPHEN 650 MG RE SUPP: 650.0000 mg | Freq: Four times a day (QID) | RECTAL | Status: DC | PRN

## 2021-05-18 MED ORDER — IOHEXOL 350 MG/ML SOLN
75.0000 mL | Freq: Once | INTRAVENOUS | Status: AC | PRN
  Administered 2021-05-18: 75 mL via INTRAVENOUS

## 2021-05-18 MED ORDER — ZOLPIDEM TARTRATE 5 MG PO TABS
10.0000 mg | ORAL_TABLET | Freq: Every day | ORAL | Status: DC
  Administered 2021-05-18: 10 mg via ORAL
  Filled 2021-05-18: qty 2

## 2021-05-18 MED ORDER — PANTOPRAZOLE SODIUM 40 MG IV SOLR
40.0000 mg | Freq: Once | INTRAVENOUS | Status: AC
  Administered 2021-05-18: 40 mg via INTRAVENOUS
  Filled 2021-05-18: qty 40

## 2021-05-18 MED ORDER — BUPROPION HCL ER (XL) 150 MG PO TB24
150.0000 mg | ORAL_TABLET | Freq: Every day | ORAL | Status: DC
  Administered 2021-05-19: 150 mg via ORAL
  Filled 2021-05-18: qty 1

## 2021-05-18 MED ORDER — PANTOPRAZOLE SODIUM 40 MG IV SOLR
40.0000 mg | Freq: Two times a day (BID) | INTRAVENOUS | Status: DC
  Administered 2021-05-18 – 2021-05-19 (×2): 40 mg via INTRAVENOUS
  Filled 2021-05-18 (×2): qty 40

## 2021-05-18 MED ORDER — DIAZEPAM 2 MG PO TABS: 2.0000 mg | ORAL_TABLET | Freq: Four times a day (QID) | ORAL | Status: DC | PRN

## 2021-05-18 MED ORDER — PRASUGREL HCL 10 MG PO TABS
10.0000 mg | ORAL_TABLET | Freq: Every day | ORAL | Status: DC
  Filled 2021-05-18 (×3): qty 1

## 2021-05-18 MED ORDER — ROSUVASTATIN CALCIUM 20 MG PO TABS
20.0000 mg | ORAL_TABLET | Freq: Every day | ORAL | Status: DC
  Administered 2021-05-19: 20 mg via ORAL
  Filled 2021-05-18: qty 1

## 2021-05-18 MED ORDER — ONDANSETRON HCL 4 MG/2ML IJ SOLN: 4.0000 mg | Freq: Four times a day (QID) | INTRAMUSCULAR | Status: DC | PRN

## 2021-05-18 MED ORDER — SODIUM CHLORIDE 0.9% FLUSH: 3.0000 mL | INTRAVENOUS | Status: DC | PRN

## 2021-05-18 MED ORDER — SODIUM CHLORIDE 0.9% FLUSH: 3.0000 mL | Freq: Two times a day (BID) | INTRAVENOUS | Status: DC

## 2021-05-18 MED ORDER — INSULIN ASPART 100 UNIT/ML IJ SOLN: 0.0000 [IU] | INTRAMUSCULAR | Status: DC

## 2021-05-18 MED ORDER — ACETAMINOPHEN 325 MG PO TABS: 650.0000 mg | ORAL_TABLET | Freq: Four times a day (QID) | ORAL | Status: DC | PRN

## 2021-05-18 NOTE — Consult Note (Signed)
Referring Provider: Dessa Phi, DO Primary Care Physician:  Celene Squibb, MD Primary Gastroenterologist:  Dr. Laural Golden  Reason for Consultation:    Melena and anemia.  HPI:   Patient is 70 year old Caucasian female who was in usual state of health until about 2 weeks ago when she was visiting [place] with her family members.  She fell and broke her left hip.  She had surgery at trauma center in [facility].  Patient's hemoglobin at the time of discharge was 7.9 g.  Patient was discharged on Xarelto which she is supposed to continue for another 4 weeks.  Patient was seen by Dr. Delphina Cahill.  She was noted to be anemic and advised to come to emergency room. Her hemoglobin is noted to be 5.9 g.  She is receiving blood transfusion.  She is hoping to be discharged.  She took Xarelto yesterday. Patient says she had tarry stools for about a week last week but not this week.  She has not passed bright red blood per rectum.  She denies hematuria or vaginal bleeding.  She has noted feeling weak and tired but she says it has been difficult to ambulate with help of walker.  She was to start physical therapy today but it has been postponed.  She denies nausea vomiting abdominal pain heartburn or dysphagia.  She is also on low-dose aspirin. She says she has a good appetite and has not lost any weight recently.  She says she has been taking Tylenol for left thigh pain.  She states pain and swelling has been decreasing every day. She had screening colonoscopy by me in August 2017 and it was within normal limits.  Patient is married.  She is a retired Education officer, [employer].  She worked in [place].  She has 2 grownup children in good health.  She has never smoked cigarettes and she does not drink alcohol. Family history is not available as she was adopted.  Past Medical History:  Diagnosis Date   AAA (abdominal aortic aneurysm)    Coronary artery disease    Depression    Diabetes  mellitus without complication ([place])    HLD (hyperlipidemia)    Peripheral arterial disease ([place])     Past Surgical History:  Procedure Laterality Date   CESAREAN SECTION     1977 and 1979   CHOLECYSTECTOMY     COLONOSCOPY N/A 03/06/2016   Procedure: COLONOSCOPY;  Surgeon: Rogene Houston, MD;  [facility];  Service: Endoscopy;  Laterality: N/A;  65   CORONARY ANGIOPLASTY WITH STENT PLACEMENT     CORONARY ARTERY BYPASS GRAFT     FEMUR SURGERY Left 05/03/2021       Abdominal aortic aneurysm.  S/p open repair with aortobiliac bypass 11/10/17      Surgery for left distal femur fracture on 05/04/2019 resulting from a fall.   Prior to Admission medications   Medication Sig Start Date End Date Taking? Authorizing Provider  albuterol (PROVENTIL HFA;VENTOLIN HFA) 108 (90 Base) MCG/ACT inhaler Inhale 1-2 puffs into the lungs every 6 (six) hours as needed for wheezing or shortness of breath.   Yes [provider]  amLODipine (NORVASC) 10 MG tablet Take 10 mg by mouth daily.   Yes [provider]  aspirin EC 81 MG tablet Take 81 mg by mouth daily.   Yes [provider]  Baclofen 5 MG TABS Take 5 mg by mouth in the morning, at noon, and at bedtime. 10 day course  Yes [provider]  buPROPion (WELLBUTRIN XL) 150 MG 24 hr tablet Take 150 mg by mouth daily.   Yes [provider]  cetirizine (ZYRTEC) 10 MG tablet Take 10 mg by mouth daily.   Yes [provider]  colchicine 0.6 MG tablet Take 0.6 mg by mouth daily as needed (flare).   Yes [provider]  diazepam (VALIUM) 2 MG tablet Take 2 mg by mouth every 6 (six) hours as needed for anxiety.   Yes [provider]  fenofibrate micronized (LOFIBRA) 134 MG capsule Take 134 mg by mouth daily before breakfast.   Yes [provider]  gabapentin (NEURONTIN) 300 MG capsule Take 300 mg by mouth 3 (three) times daily.   Yes [provider]  metFORMIN  (GLUCOPHAGE) 1000 MG tablet Take 1,000 mg by mouth 2 (two) times daily.   Yes [provider]  metoprolol tartrate (LOPRESSOR) 50 MG tablet Take 12.5 mg by mouth 2 (two) times daily.   Yes [provider]  Multiple Vitamin (MULTIVITAMIN WITH MINERALS) TABS tablet Take 1 tablet by mouth daily.   Yes [provider]  olmesartan (BENICAR) 40 MG tablet Take 20 mg by mouth daily.   Yes [provider]  oxyCODONE (OXY IR/ROXICODONE) 5 MG immediate release tablet Take 5 mg by mouth every 4 (four) hours as needed for severe pain.   Yes [provider]  prasugrel (EFFIENT) 10 MG TABS tablet Take 10 mg by mouth daily.   Yes [provider]  rivaroxaban (XARELTO) 20 MG TABS tablet Take 20 mg by mouth daily with supper.   Yes [provider]  rosuvastatin (CRESTOR) 20 MG tablet Take 20 mg by mouth daily.   Yes [provider]  zolpidem (AMBIEN) 10 MG tablet Take 10 mg by mouth at bedtime.   Yes [provider]  benzonatate (TESSALON) 100 MG capsule Take 1 capsule (100 mg total) by mouth every 8 (eight) hours. Patient not taking: No sig reported 06/19/17   Dorie Rank, MD    Current Facility-Administered Medications  Medication Dose Route Frequency Provider Last Rate Last Admin   0.9 %  sodium chloride infusion  10 mL/hr Intravenous Once Godfrey Pick, MD       Current Outpatient Medications  Medication Sig Dispense Refill   albuterol (PROVENTIL HFA;VENTOLIN HFA) 108 (90 Base) MCG/ACT inhaler Inhale 1-2 puffs into the lungs every 6 (six) hours as needed for wheezing or shortness of breath.     amLODipine (NORVASC) 10 MG tablet Take 10 mg by mouth daily.     aspirin EC 81 MG tablet Take 81 mg by mouth daily.     Baclofen 5 MG TABS Take 5 mg by mouth in the morning, at noon, and at bedtime. 10 day course     buPROPion (WELLBUTRIN XL) 150 MG 24 hr tablet Take 150 mg by mouth daily.     cetirizine (ZYRTEC) 10 MG tablet Take 10 mg by  mouth daily.     colchicine 0.6 MG tablet Take 0.6 mg by mouth daily as needed (flare).     diazepam (VALIUM) 2 MG tablet Take 2 mg by mouth every 6 (six) hours as needed for anxiety.     fenofibrate micronized (LOFIBRA) 134 MG capsule Take 134 mg by mouth daily before breakfast.     gabapentin (NEURONTIN) 300 MG capsule Take 300 mg by mouth 3 (three) times daily.     metFORMIN (GLUCOPHAGE) 1000 MG tablet Take 1,000 mg by mouth  2 (two) times daily.     metoprolol tartrate (LOPRESSOR) 50 MG tablet Take 12.5 mg by mouth 2 (two) times daily.     Multiple Vitamin (MULTIVITAMIN WITH MINERALS) TABS tablet Take 1 tablet by mouth daily.     olmesartan (BENICAR) 40 MG tablet Take 20 mg by mouth daily.     oxyCODONE (OXY IR/ROXICODONE) 5 MG immediate release tablet Take 5 mg by mouth every 4 (four) hours as needed for severe pain.     prasugrel (EFFIENT) 10 MG TABS tablet Take 10 mg by mouth daily.     rivaroxaban (XARELTO) 20 MG TABS tablet Take 20 mg by mouth daily with supper.     rosuvastatin (CRESTOR) 20 MG tablet Take 20 mg by mouth daily.     zolpidem (AMBIEN) 10 MG tablet Take 10 mg by mouth at bedtime.     benzonatate (TESSALON) 100 MG capsule Take 1 capsule (100 mg total) by mouth every 8 (eight) hours. (Patient not taking: No sig reported) 21 capsule 0    Allergies as of 05/18/2021 - Review Complete 05/18/2021  Allergen Reaction Noted   Codeine Itching and Rash 10/23/2013   Hydrocodone Itching and Rash 10/23/2013   Lipitor [atorvastatin] Itching and Rash 10/23/2013    Family History  Problem Relation Age of Onset   Parkinson's disease Brother     Social History   Socioeconomic History   Marital status: Divorced    Spouse name: Not on file   Number of children: Not on file   Years of education: Not on file   Highest education level: Not on file  Occupational History   Not on file  Tobacco Use   Smoking status: Every Day   Smokeless tobacco: Never  Substance and Sexual  Activity   Alcohol use: Yes    Comment: rarely   Drug use: No   Sexual activity: Not on file  Other Topics Concern   Not on file  Social History Narrative   Not on file   Social Determinants of Health   Financial Resource Strain: Not on file  Food Insecurity: Not on file  Transportation Needs: Not on file  Physical Activity: Not on file  Stress: Not on file  Social Connections: Not on file  Intimate Partner Violence: Not on file    Review of Systems: See HPI, otherwise normal ROS  Physical Exam: Temp:  [97.8 F (36.6 C)-98.3 F (36.8 C)] 98.3 F (36.8 C) (10/21 1443) Pulse Rate:  [62-67] 66 (10/21 1515) Resp:  [15-18] 18 (10/21 1443) BP: (123-148)/(41-58) 148/58 (10/21 1515) SpO2:  [96 %-100 %] 100 % (10/21 1515)  Patient was seen while in emergency room. She appears pale.  Conjunctiva is also pale.  Sclera is nonicteric. Oropharyngeal mucosa is normal.  She has dentures in place. No neck masses or thyromegaly noted. Cardiac exam with regular rhythm normal S1 and S2.  No murmur or gallop noted. Auscultation lungs reveal vesicular breath sounds bilaterally. Abdomen is full.  She has upper and lower midline scars.  Bowel sounds are normal.  On palpation is soft and nontender with organomegaly or masses. She does not have peripheral edema or clubbing.   Lab Results: Recent Labs    05/18/21 1156  WBC 7.8  HGB 5.9*  HCT 20.4*  PLT 353   BMET Recent Labs    05/18/21 1156  NA 141  K 3.8  CL 109  CO2 24  GLUCOSE 190*  BUN 24*  CREATININE 1.04*  CALCIUM 9.6  LFT Recent Labs    05/18/21 1156  PROT 6.5  ALBUMIN 3.6  AST 48*  ALT 22  ALKPHOS 69  BILITOT 0.8     Assessment;  Patient is 70 year old Caucasian female who presents with history of melena and anemia.  Patient fell and broke her left femur on 05/03/2021 and underwent placement of rod at a hospital in [place], [place].  She was discharged on low-dose aspirin and rivaroxaban.  She  reports melena 1 week ago but stool is not normal.  Her hemoglobin was 5.9 g and she is receiving a unit of PRBCs.  She appears to be hemodynamically stable. Past history significant for surgery for AAA with aorto by iliac bypass in May 2019.  Given his history need to rule out aortoenteric fistula.  However risk of ulcer with bleed much higher than aortoenteric fistula.   Recommendations;  CTA abdomen and pelvis today. If CT is negative would consider esophagogastroduodenoscopy but patient does not want to proceed with endoscopic evaluation when he is hoping to be discharged.  Therefore this study can be performed on an outpatient basis. It would be nice to check her stool and hopefully confirm that it is guaiac negative. Will need to hold anticoagulant for 3 days if stool is guaiac positive and if his stool is guaiac negative we do not need to interrupt anticoagulant.   LOS: 0 days   Amedeo Detweiler  05/18/2021, 4:16 PM

## 2021-05-18 NOTE — ED Notes (Signed)
Date and time results received: 05/18/21 1224 (use smartphrase ".now" to insert current time)  Test: Hgb Critical Value: 5.9  Name of Provider Notified: D. Dixon  Orders Received? Or Actions Taken?: see chart

## 2021-05-18 NOTE — H&P (Signed)
History and Physical    Alicia Ortega FAO:[id number] DOB: Jul 08, 1951 DOA: 05/18/2021  PCP: Celene Squibb, MD  Patient coming from: Home  Chief Complaint: Anemia   HPI: Alicia Ortega is a 70 y.o. female with medical history significant of DM and CAD, AAA who recently underwent distal femur fracture repair on 10/6. On discharge, she was put on Xarelto for DVT prophylaxis. Her baseline hgb is around 11. She followed up with her PCP and was found to have hgb down to 5.7. She admits to dizziness and lightheadedness.  She denies any chest pain or shortness of breath, no nausea, vomiting or abdominal pain.  She denies any syncopal episodes.  She had a colonoscopy in 2017 with Dr. Laural Golden which revealed external hemorrhoids, normal colon.  She has never had an EGD.  She denies taking any over-the-counter ibuprofen, has been taking Tylenol for her leg pain  ED Course: Hgb 5.9. Blood transfusion ordered.   Review of Systems: As per HPI. Otherwise, all other review of systems reviewed and are negative.   Past Medical History:  Diagnosis Date   AAA (abdominal aortic aneurysm)    Coronary artery disease    Depression    Diabetes mellitus without complication ([place])    HLD (hyperlipidemia)    Peripheral arterial disease ([place])     Past Surgical History:  Procedure Laterality Date   CESAREAN SECTION     1977 and 1979   CHOLECYSTECTOMY     COLONOSCOPY N/A 03/06/2016   Procedure: COLONOSCOPY;  Surgeon: Rogene Houston, MD;  [facility];  Service: Endoscopy;  Laterality: N/A;  930   CORONARY ANGIOPLASTY WITH STENT PLACEMENT     CORONARY ARTERY BYPASS GRAFT     FEMUR SURGERY Left 05/03/2021     reports that she has been smoking. She has never used smokeless tobacco. She reports current alcohol use. She reports that she does not use drugs.  Allergies  Allergen Reactions   Codeine Itching and Rash   Hydrocodone Itching and Rash   Lipitor [Atorvastatin] Itching and Rash    Family  History  Problem Relation Age of Onset   Parkinson's disease Brother      Prior to Admission medications   Medication Sig Start Date End Date Taking? Authorizing Provider  albuterol (PROVENTIL HFA;VENTOLIN HFA) 108 (90 Base) MCG/ACT inhaler Inhale 1-2 puffs into the lungs every 6 (six) hours as needed for wheezing or shortness of breath.   Yes [provider]  amLODipine (NORVASC) 10 MG tablet Take 10 mg by mouth daily.   Yes [provider]  aspirin EC 81 MG tablet Take 81 mg by mouth daily.   Yes [provider]  Baclofen 5 MG TABS Take 5 mg by mouth in the morning, at noon, and at bedtime. 10 day course   Yes [provider]  buPROPion (WELLBUTRIN XL) 150 MG 24 hr tablet Take 150 mg by mouth daily.   Yes [provider]  cetirizine (ZYRTEC) 10 MG tablet Take 10 mg by mouth daily.   Yes [provider]  colchicine 0.6 MG tablet Take 0.6 mg by mouth daily as needed (flare).   Yes [provider]  diazepam (VALIUM) 2 MG tablet Take 2 mg by mouth every 6 (six) hours as needed for anxiety.   Yes [provider]  fenofibrate micronized (LOFIBRA) 134 MG capsule Take 134 mg by mouth daily before breakfast.   Yes [provider]  gabapentin (NEURONTIN) 300 MG  capsule Take 300 mg by mouth 3 (three) times daily.   Yes [provider]  metFORMIN (GLUCOPHAGE) 1000 MG tablet Take 1,000 mg by mouth 2 (two) times daily.   Yes [provider]  metoprolol tartrate (LOPRESSOR) 50 MG tablet Take 12.5 mg by mouth 2 (two) times daily.   Yes [provider]  Multiple Vitamin (MULTIVITAMIN WITH MINERALS) TABS tablet Take 1 tablet by mouth daily.   Yes [provider]  olmesartan (BENICAR) 40 MG tablet Take 20 mg by mouth daily.   Yes [provider]  oxyCODONE (OXY IR/ROXICODONE) 5 MG immediate release tablet Take 5 mg by mouth every 4 (four) hours as needed for severe pain.   Yes [provider]  prasugrel (EFFIENT) 10 MG TABS tablet Take 10 mg by mouth daily.   Yes [provider]  rivaroxaban (XARELTO) 20 MG TABS tablet Take 20 mg by mouth daily with supper.   Yes [provider]  rosuvastatin (CRESTOR) 20 MG tablet Take 20 mg by mouth daily.   Yes [provider]  zolpidem (AMBIEN) 10 MG tablet Take 10 mg by mouth at bedtime.   Yes [provider]  benzonatate (TESSALON) 100 MG capsule Take 1 capsule (100 mg total) by mouth every 8 (eight) hours. Patient not taking: No sig reported 06/19/17   Dorie Rank, MD  doxycycline (VIBRAMYCIN) 100 MG capsule Take 1 capsule (100 mg total) by mouth 2 (two) times daily. Patient not taking: No sig reported 06/19/17   Dorie Rank, MD    Physical Exam: Vitals:   05/18/21 1406 05/18/21 1420 05/18/21 1430 05/18/21 1443  BP:  (!) 131/48 (!) 136/56 (!) 143/44  Pulse:  62 64 62  Resp: 15 17  18   Temp:  97.8 F (36.6 C)  98.3 F (36.8 C)  TempSrc:  Oral  Oral  SpO2:  99% 100% 100%    Constitutional: NAD, calm, comfortable Eyes: PERRL, lids and conjunctivae normal ENMT: Mucous membranes are moist. Normal dentition.  Respiratory: Clear to auscultation bilaterally, no wheezing, no crackles. Normal respiratory effort. No accessory muscle use. No conversational dyspnea  Cardiovascular: Regular rate and rhythm, no murmurs. No extremity edema.  Abdomen: Soft, nondistended, nontender to palpation. Bowel sounds positive.  Musculoskeletal: No joint deformity upper and lower extremities. No contractures. Normal muscle tone.  Skin: no rashes, lesions, ulcers on exposed skin  Neurologic: Alert and oriented, speech fluent, CN 2-12 grossly intact. No focal deficits.   Psychiatric: Normal judgment and insight. Normal mood and affect   Labs on Admission: I have personally reviewed following labs and imaging studies  CBC: Recent Labs  Lab 05/18/21 1156  WBC 7.8  HGB 5.9*  HCT 20.4*  MCV 103.0*  PLT  128   Basic Metabolic Panel: Recent Labs  Lab 05/18/21 1156  NA 141  K 3.8  CL 109  CO2 24  GLUCOSE 190*  BUN 24*  CREATININE 1.04*  CALCIUM 9.6   GFR: CrCl cannot be calculated (Unknown ideal weight.). Liver Function Tests: Recent Labs  Lab 05/18/21 1156  AST 48*  ALT 22  ALKPHOS 69  BILITOT 0.8  PROT 6.5  ALBUMIN 3.6   No results for input(s): LIPASE, AMYLASE in the last 168 hours. No results for input(s): AMMONIA in the last 168 hours. Coagulation Profile: No results for input(s): INR, PROTIME in the last 168 hours. Cardiac Enzymes: No results for input(s): CKTOTAL, CKMB, CKMBINDEX, TROPONINI in the last 168 hours. BNP (last 3 results) No  results for input(s): PROBNP in the last 8760 hours. HbA1C: No results for input(s): HGBA1C in the last 72 hours. CBG: No results for input(s): GLUCAP in the last 168 hours. Lipid Profile: No results for input(s): CHOL, HDL, LDLCALC, TRIG, CHOLHDL, LDLDIRECT in the last 72 hours. Thyroid Function Tests: No results for input(s): TSH, T4TOTAL, FREET4, T3FREE, THYROIDAB in the last 72 hours. Anemia Panel: No results for input(s): VITAMINB12, FOLATE, FERRITIN, TIBC, IRON, RETICCTPCT in the last 72 hours. Urine analysis:    Component Value Date/Time   COLORURINE YELLOW 10/23/2013 2110   APPEARANCEUR CLEAR 10/23/2013 2110   LABSPEC <1.005 (L) 10/23/2013 2110   PHURINE 6.0 10/23/2013 2110   GLUCOSEU NEGATIVE 10/23/2013 2110   HGBUR NEGATIVE 10/23/2013 2110   BILIRUBINUR NEGATIVE 10/23/2013 2110   KETONESUR NEGATIVE 10/23/2013 2110   PROTEINUR NEGATIVE 10/23/2013 2110   UROBILINOGEN 0.2 10/23/2013 2110   NITRITE NEGATIVE 10/23/2013 2110   LEUKOCYTESUR NEGATIVE 10/23/2013 2110   Sepsis Labs: !!!!!!!!!!!!!!!!!!!!!!!!!!!!!!!!!!!!!!!!!!!! @LABRCNTIP (procalcitonin:4,lacticidven:4) )No results found for this or any previous visit (from the past 240 hour(s)).   Radiological Exams on Admission: No results  found.   Assessment/Plan Principal Problem:   Acute blood loss anemia Active Problems:   Symptomatic anemia   CAD (coronary artery disease)   HTN (hypertension)   HLD (hyperlipidemia)   Depression   DM (diabetes mellitus), type 2 (HCC)   Acute blood loss anemia, symptomatic anemia -Baseline hemoglobin around 12.2 in our system 3 years ago, 11.2 on admission and --> 7.9 on discharge on 05/05/21 post-op. Started on xarelto post-op for DVT  -Hold aspirin, xarelto  -Transfusion 2 units and monitor H&H  -IV PPI -NPO -GI consult  Recent left distal femur fracture s/p repair 05/03/21 -PT OT   CAD -S/p CABG and mitral valve repair -S/p cath with stent placement Jan 2022 with DES x2  -Hold aspirin but will continue effient since she had a stent placed < 12 months   HTN -Continue Lopressor but will hold norvasc and benicar in setting of blood loss anemia   Hyperlipidemia -Continue Crestor  Depression/anxiety -Continue Wellbutrin, Valium as needed  Diabetes mellitus -Hold metformin, continue sliding scale insulin   DVT prophylaxis: SCD Code Status: Full, discussed at time of admission  Family Communication: None at bedside  Disposition Plan: Pending GI consult, patient to return home on discharge  Consults called: GI  Severity of Illness: The appropriate patient status for this patient is OBSERVATION. Observation status is judged to be reasonable and necessary in order to provide the required intensity of service to ensure the patient's safety. The patient's presenting symptoms, physical exam findings, and initial radiographic and laboratory data in the context of their medical condition is felt to place them at decreased risk for further clinical deterioration. Furthermore, it is anticipated that the patient will be medically stable for discharge from the hospital within 2 midnights of admission.   Dessa Phi, DO [facility] 05/18/2021, 2:47 PM   Available via  Epic secure chat 7am-7pm After these hours, please refer to coverage provider listed on [email or web address]

## 2021-05-18 NOTE — ED Provider Notes (Signed)
[facility] EMERGENCY DEPARTMENT Provider Note   CSN: [id number] Arrival date & time: 05/18/21  1040     History Chief Complaint  Patient presents with   low hemoglobin    Alicia Ortega is a 70 y.o. female.  HPI Patient presents to the ED at the instruction of her primary doctor for anemia.  She had a recent hospital admission in [place], [place] for distal femur fracture.  She underwent surgery on 10/6.  Prior to hospitalization, hemoglobin was in the range of 11.  On 10/8, hemoglobin was 7.9.  She was discharged 2 weeks ago.  Since that time, she has had dark stools.  On discharge, she was started on Xarelto.  She has been taking as prescribed.  She got her blood checked yesterday and her hemoglobin was 5.7.  She does endorse recent symptoms of dizziness and lightheadedness.  She denies any new onset of shortness of breath.  She denies any syncopal episodes.    Past Medical History:  Diagnosis Date   AAA (abdominal aortic aneurysm)    Coronary artery disease    Depression    Diabetes mellitus without complication ([place])    HLD (hyperlipidemia)    Peripheral arterial disease ([place])     Patient Active Problem List   Diagnosis Date Noted   Acute blood loss anemia 05/18/2021   Symptomatic anemia 05/18/2021   CAD (coronary artery disease) 05/18/2021   HTN (hypertension) 05/18/2021   HLD (hyperlipidemia) 05/18/2021   Depression 05/18/2021   DM (diabetes mellitus), type 2 ([place]) 05/18/2021    Past Surgical History:  Procedure Laterality Date   CESAREAN SECTION     1977 and 1979   CHOLECYSTECTOMY     COLONOSCOPY N/A 03/06/2016   Procedure: COLONOSCOPY;  Surgeon: Rogene Houston, MD;  Location: [facility];  Service: Endoscopy;  Laterality: N/A;  930   CORONARY ANGIOPLASTY WITH STENT PLACEMENT     CORONARY ARTERY BYPASS GRAFT     FEMUR SURGERY Left 05/03/2021     OB History   No obstetric history on file.     Family History  Problem Relation Age of Onset    Parkinson's disease Brother     Social History   Tobacco Use   Smoking status: Every Day   Smokeless tobacco: Never  Substance Use Topics   Alcohol use: Yes    Comment: rarely   Drug use: No    Home Medications Prior to Admission medications   Medication Sig Start Date End Date Taking? Authorizing Provider  albuterol (PROVENTIL HFA;VENTOLIN HFA) 108 (90 Base) MCG/ACT inhaler Inhale 1-2 puffs into the lungs every 6 (six) hours as needed for wheezing or shortness of breath.   Yes [provider]  amLODipine (NORVASC) 10 MG tablet Take 10 mg by mouth daily.   Yes [provider]  aspirin EC 81 MG tablet Take 81 mg by mouth daily.   Yes [provider]  Baclofen 5 MG TABS Take 5 mg by mouth in the morning, at noon, and at bedtime. 10 day course   Yes [provider]  buPROPion (WELLBUTRIN XL) 150 MG 24 hr tablet Take 150 mg by mouth daily.   Yes [provider]  cetirizine (ZYRTEC) 10 MG tablet Take 10 mg by mouth daily.   Yes [provider]  colchicine 0.6 MG tablet Take 0.6 mg by mouth daily as needed (flare).   Yes [provider]  diazepam (VALIUM) 2 MG tablet Take 2 mg by  mouth every 6 (six) hours as needed for anxiety.   Yes [provider]  fenofibrate micronized (LOFIBRA) 134 MG capsule Take 134 mg by mouth daily before breakfast.   Yes [provider]  gabapentin (NEURONTIN) 300 MG capsule Take 300 mg by mouth 3 (three) times daily.   Yes [provider]  metFORMIN (GLUCOPHAGE) 1000 MG tablet Take 1,000 mg by mouth 2 (two) times daily.   Yes [provider]  metoprolol tartrate (LOPRESSOR) 50 MG tablet Take 12.5 mg by mouth 2 (two) times daily.   Yes [provider]  Multiple Vitamin (MULTIVITAMIN WITH MINERALS) TABS tablet Take 1 tablet by mouth daily.   Yes [provider]  olmesartan (BENICAR) 40 MG tablet Take 20 mg by mouth daily.   Yes [provider]   oxyCODONE (OXY IR/ROXICODONE) 5 MG immediate release tablet Take 5 mg by mouth every 4 (four) hours as needed for severe pain.   Yes [provider]  prasugrel (EFFIENT) 10 MG TABS tablet Take 10 mg by mouth daily.   Yes [provider]  rosuvastatin (CRESTOR) 20 MG tablet Take 20 mg by mouth daily.   Yes [provider]  zolpidem (AMBIEN) 10 MG tablet Take 10 mg by mouth at bedtime.   Yes [provider]  pantoprazole (PROTONIX) 40 MG tablet Take 1 tablet (40 mg total) by mouth 2 (two) times daily. 05/19/21   Orson Eva, MD    Allergies    Codeine, Hydrocodone, and Lipitor [atorvastatin]  Review of Systems   Review of Systems  Constitutional:  Positive for fatigue. Negative for activity change, appetite change, chills and fever.  HENT:  Negative for congestion, ear pain and sore throat.   Eyes:  Negative for pain and visual disturbance.  Respiratory:  Negative for cough, chest tightness, shortness of breath and wheezing.   Cardiovascular:  Negative for chest pain and palpitations.  Gastrointestinal:  Positive for blood in stool (Dark stools). Negative for abdominal pain, diarrhea, nausea and vomiting.  Genitourinary:  Negative for dysuria, flank pain, hematuria and pelvic pain.  Musculoskeletal:  Negative for arthralgias, back pain, myalgias and neck pain.  Skin:  Negative for color change and rash.  Neurological:  Positive for dizziness and light-headedness. Negative for seizures, syncope, weakness and numbness.  Hematological:  Bruises/bleeds easily.  All other systems reviewed and are negative.  Physical Exam Updated Vital Signs BP (!) 157/53 (BP Location: Right Arm)   Pulse 65   Temp 97.8 F (36.6 C) (Oral)   Resp 19   Ht 5\' 2"  (1.575 m)   Wt 53.6 kg   SpO2 98%   BMI 21.61 kg/m   Physical Exam Vitals and nursing note reviewed.  Constitutional:      General: She is not in acute distress.    Appearance: Normal appearance. She is  well-developed. She is not ill-appearing, toxic-appearing or diaphoretic.  HENT:     Head: Normocephalic and atraumatic.     Right Ear: External ear normal.     Left Ear: External ear normal.     Nose: Nose normal.  Eyes:     General: No scleral icterus.    Extraocular Movements: Extraocular movements intact.     Conjunctiva/sclera: Conjunctivae normal.  Cardiovascular:     Rate and Rhythm: Normal rate and regular rhythm.     Heart sounds: No murmur heard. Pulmonary:     Effort: Pulmonary effort is normal. No respiratory distress.     Breath sounds: Normal  breath sounds.  Abdominal:     Palpations: Abdomen is soft.     Tenderness: There is no abdominal tenderness.  Genitourinary:    Comments: Patient refused DRE Musculoskeletal:        General: Normal range of motion.     Cervical back: Normal range of motion and neck supple. No rigidity.     Right lower leg: No edema.     Left lower leg: No edema.  Skin:    General: Skin is warm and dry.     Coloration: Skin is pale. Skin is not jaundiced.  Neurological:     General: No focal deficit present.     Mental Status: She is alert and oriented to person, place, and time.     Cranial Nerves: No cranial nerve deficit.     Sensory: No sensory deficit.     Motor: No weakness.  Psychiatric:        Mood and Affect: Mood normal.        Behavior: Behavior normal.        Thought Content: Thought content normal.        Judgment: Judgment normal.    ED Results / Procedures / Treatments   Labs (all labs ordered are listed, but only abnormal results are displayed) Labs Reviewed  COMPREHENSIVE METABOLIC PANEL - Abnormal; Notable for the following components:      Result Value   Glucose, Bld 190 (*)    BUN 24 (*)    Creatinine, Ser 1.04 (*)    AST 48 (*)    GFR, Estimated 57 (*)    All other components within normal limits  CBC - Abnormal; Notable for the following components:   RBC 1.98 (*)    Hemoglobin 5.9 (*)    HCT 20.4 (*)     MCV 103.0 (*)    MCHC 28.9 (*)    RDW 19.2 (*)    nRBC 0.8 (*)    All other components within normal limits  CBC - Abnormal; Notable for the following components:   RBC 3.11 (*)    Hemoglobin 9.0 (*)    HCT 29.2 (*)    RDW 18.0 (*)    nRBC 0.6 (*)    All other components within normal limits  HEMOGLOBIN AND HEMATOCRIT, BLOOD - Abnormal; Notable for the following components:   Hemoglobin 8.9 (*)    HCT 28.9 (*)    All other components within normal limits  RESP PANEL BY RT-PCR (FLU A&B, COVID) ARPGX2  GLUCOSE, CAPILLARY  GLUCOSE, CAPILLARY  GLUCOSE, CAPILLARY  GLUCOSE, CAPILLARY  HEMOGLOBIN A1C  TYPE AND SCREEN  ABO/RH  PREPARE RBC (CROSSMATCH)    EKG None  Radiology CT ANGIO ABDOMEN W &/OR WO CONTRAST  Result Date: 05/18/2021 CLINICAL DATA:  [place], history of abdominal aortic aneurysm status post repair, rectal bleeding since lower extremity surgery 2 weeks ago EXAM: CTA ABDOMEN AND PELVIS WITHOUT AND WITH CONTRAST TECHNIQUE: Multidetector CT imaging of the abdomen and pelvis was performed using the standard protocol during bolus administration of intravenous contrast. Multiplanar reconstructed images and MIPs were obtained and reviewed to evaluate the vascular anatomy. CONTRAST:  27mL OMNIPAQUE IOHEXOL 350 MG/ML SOLN COMPARISON:  None. FINDINGS: VASCULAR Aorta: There is moderate atherosclerosis within the suprarenal abdominal aorta. No flow limiting stenosis. There has been an aorto bi-iliac bypass from prior abdominal aneurysm repair, which is widely patent. Celiac: Patent without evidence of aneurysm, dissection, vasculitis or significant stenosis. Mild atherosclerosis at the origin. SMA: There is  moderate atheromatous plaque at the origin of the SMA, with stenosis estimated greater than 70%. Remainder of the SMA is widely patent with no evidence of aneurysm, dissection, or vasculitis. Renals: There is significant bilateral renal artery atherosclerosis, stenosis estimated  less than 50% however. No evidence of vasculitis or dissection. IMA: The IMA is not identified after previous aorto bi-iliac bypass. Inflow: Patent without evidence of aneurysm, dissection, vasculitis or significant stenosis. Moderate atherosclerosis without significant stenosis. Proximal Outflow: Bilateral common femoral and visualized portions of the superficial and profunda femoral arteries are patent without evidence of aneurysm, dissection, vasculitis or significant stenosis. Veins: No obvious venous abnormality within the limitations of this arterial phase study. Review of the MIP images confirms the above findings. NON-VASCULAR Lower chest: No acute pleural or parenchymal lung disease. Hepatobiliary: Subtle nodularity of the liver capsule may reflect cirrhosis. No focal liver abnormality is seen. Status post cholecystectomy. No biliary dilatation. Pancreas: Unremarkable. No pancreatic ductal dilatation or surrounding inflammatory changes. Spleen: Normal in size without focal abnormality. Adrenals/Urinary Tract: Small left renal cyst. Otherwise the kidneys enhance normally and symmetrically. No urinary tract calculi or obstruction. The adrenals and bladder are grossly unremarkable. Stomach/Bowel: No bowel obstruction or ileus. No bowel wall thickening or inflammatory change. No intraluminal accumulation of contrast to suggest active gastrointestinal bleeding. Lymphatic: No pathologic adenopathy. Reproductive: Uterus and bilateral adnexa are unremarkable. Other: No free fluid or free gas.  No abdominal wall hernia. Musculoskeletal: No acute or destructive bony lesions. Reconstructed images demonstrate no additional findings. IMPRESSION: VASCULAR 1. Postsurgical changes from prior aorto bi-iliac bypass for AAA repair. 2. 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 The distal SMA is widely patent however. 3. No evidence of active gastrointestinal bleeding. 4.   Aortic Atherosclerosis (ICD10-I70.0). NON-VASCULAR 1. No acute intra-abdominal or intrapelvic process. 2. Subtle nodularity of the liver capsule which may reflect underlying cirrhosis. Electronically Signed   By: Randa Ngo M.D.   On: 05/18/2021 17:47    Procedures Procedures   Medications Ordered in ED Medications  pantoprazole (PROTONIX) injection 40 mg (40 mg Intravenous Given 05/18/21 1526)  iohexol (OMNIPAQUE) 350 MG/ML injection 75 mL (75 mLs Intravenous Contrast Given 05/18/21 1707)    ED Course  I have reviewed the triage vital signs and the nursing notes.  Pertinent labs & imaging results that were available during my care of the patient were reviewed by me and considered in my medical decision making (see chart for details).    MDM Rules/Calculators/A&P                         CRITICAL CARE Performed by: Godfrey Pick   Total critical care time: 35 minutes  Critical care time was exclusive of separately billable procedures and treating other patients.  Critical care was necessary to treat or prevent imminent or life-threatening deterioration.  Critical care was time spent personally by me on the following activities: development of treatment plan with patient and/or surrogate as well as nursing, discussions with consultants, evaluation of patient's response to treatment, examination of patient, obtaining history from patient or surrogate, ordering and performing treatments and interventions, ordering and review of laboratory studies, ordering and review of radiographic studies, pulse oximetry and re-evaluation of patient's condition.   Patient presents for symptomatic anemia.  Per chart review, her baseline hemoglobin is 11-12.  Intra-abdominal 7.9 following femur surgery 2 weeks ago.  She had lab work done yesterday and was  told that it was 5.7.  On arrival in the ED, she is alert and oriented.  Vital signs are normal.  She denies any current discomfort.  She does endorse  recent near syncopal symptoms and states that she has had dark stools over the past 2 weeks.  This coincides with initiation of blood thinning medication following her surgery.  Labwork was obtained and low hemoglobin was confirmed patient consented for blood transfusion and 2 units were ordered.  Patient was admitted to hospitalist for ongoing care.  Final Clinical Impression(s) / ED Diagnoses Final diagnoses:  Low hemoglobin    Rx / DC Orders ED Discharge Orders          Ordered    pantoprazole (PROTONIX) 40 MG tablet  2 times daily        05/19/21 1009             Godfrey Pick, MD 05/19/21 2343

## 2021-05-18 NOTE — ED Triage Notes (Signed)
States she was notified by Dr Durene Cal office in come in for a blood transfusion

## 2021-05-18 NOTE — Plan of Care (Signed)
Care plan started

## 2021-05-19 DIAGNOSIS — K922 Gastrointestinal hemorrhage, unspecified: Secondary | ICD-10-CM | POA: Diagnosis not present

## 2021-05-19 DIAGNOSIS — D62 Acute posthemorrhagic anemia: Secondary | ICD-10-CM | POA: Diagnosis not present

## 2021-05-19 DIAGNOSIS — D649 Anemia, unspecified: Secondary | ICD-10-CM

## 2021-05-19 LAB — GLUCOSE, CAPILLARY
Glucose-Capillary: 80 mg/dL (ref 70–99)
Glucose-Capillary: 84 mg/dL (ref 70–99)
Glucose-Capillary: 87 mg/dL (ref 70–99)

## 2021-05-19 LAB — CBC
HCT: 29.2 % — ABNORMAL LOW (ref 36.0–46.0)
Hemoglobin: 9 g/dL — ABNORMAL LOW (ref 12.0–15.0)
MCH: 28.9 pg (ref 26.0–34.0)
MCHC: 30.8 g/dL (ref 30.0–36.0)
MCV: 93.9 fL (ref 80.0–100.0)
Platelets: 247 10*3/uL (ref 150–400)
RBC: 3.11 MIL/uL — ABNORMAL LOW (ref 3.87–5.11)
RDW: 18 % — ABNORMAL HIGH (ref 11.5–15.5)
WBC: 5.2 10*3/uL (ref 4.0–10.5)
nRBC: 0.6 % — ABNORMAL HIGH (ref 0.0–0.2)

## 2021-05-19 LAB — HEMOGLOBIN AND HEMATOCRIT, BLOOD
HCT: 28.9 % — ABNORMAL LOW (ref 36.0–46.0)
Hemoglobin: 8.9 g/dL — ABNORMAL LOW (ref 12.0–15.0)

## 2021-05-19 MED ORDER — ASPIRIN EC 81 MG PO TBEC: 81.0000 mg | DELAYED_RELEASE_TABLET | Freq: Every day | ORAL | Status: DC

## 2021-05-19 MED ORDER — PANTOPRAZOLE SODIUM 40 MG PO TBEC: 40.0000 mg | DELAYED_RELEASE_TABLET | Freq: Two times a day (BID) | ORAL | 1 refills | Status: AC

## 2021-05-19 MED ORDER — PANTOPRAZOLE SODIUM 40 MG PO TBEC: 40.0000 mg | DELAYED_RELEASE_TABLET | Freq: Two times a day (BID) | ORAL | Status: DC

## 2021-05-19 NOTE — Discharge Summary (Addendum)
Physician Discharge Summary  Alicia Ortega MCN:[id number] DOB: 06-02-1951 DOA: 05/18/2021  PCP: Celene Squibb, MD  Admit date: 05/18/2021 Discharge date: 05/19/2021  Admitted From: Home Disposition:  Home   Recommendations for Outpatient Follow-up:  Follow up with PCP in 1-2 weeks Please obtain BMP/CBC in one week Please follow up on the following pending results:  Home Health: [facility] Equipment/Devices:None  Discharge Condition: Stable CODE STATUS: FULL Diet recommendation: Heart Healthy   Brief/Interim Summary: 70 year old female with a history of diabetes mellitus type 2, coronary disease, hyperlipidemia, hypertension, peripheral arterial disease, ischemic cardiomyopathy, mitral valve repair, AAA status postrepair presenting with symptomatic anemia.  The patient has been feeling generalized weakness, lightheaded and dizzy.  She has not had any chest pain, shortness of breath, nausea, vomiting, abdominal pain, dysuria, hematuria.  She relates a history of melanotic stool for 1 week prior to admission.  Her PCP checked routine blood work and showed a hemoglobin of 5.7.  She was instructed to go to emergency department for further evaluation.  Notably, the patient had a recent hospital admission from 05/02/2021 to 05/05/2021 when she had a mechanical fall while visiting [place].  She was admitted to [facility] where she had ORIF/nail fixation of her left distal femur fracture.  During that hospitalization, her olmesartan was discontinued temporarily secondary to AKI.  Her metoprolol was also decreased to 25 mg twice daily secondary to bradycardia.  She had been taking 50 mg twice daily.  The patient was discharged home on rivaroxaban 10 mg daily.  She was instructed to continue taking her Effient and aspirin. Upon admission, the patient was noted to have a hemoglobin of 5.9.  The patient was transfused 2 units PRBC.  GI was consulted.  Dr. Laural Golden saw the  patient.  She underwent CTA of the abdomen and pelvis which showed no evidence of active GI bleed.  She did have focal stenosis at the origin of the SMA of 70%.  The distal SMA was widely patent.  It showed a stable aortic biiliac bypass.  There was no bowel wall thickening, but there was a nodular liver capsule. The patient's hemoglobin was 9.0 after transfusion and remained stable.  She remained hemodynamically stable.  She did not want any further diagnostic studies.  The case was discussed with GI on the day of discharge and the patient will be discharged home with consideration for outpatient endoscopy.  Discharge Diagnoses:  Symptomatic anemia/acute blood loss anemia -Transfused 2 units PRBC -Patient was treated with IV PPI -Appreciate GI consult -CTA abdomen and pelvis negative for active GI bleed -Aspirin, Effient and rivaroxaban were held temporarily -Hgb 9.0 on day of d/c -patient did not want to stay for endoscopy--this was discussed with GI, Dr. Laural Golden who will arrange outpatient follow up -after discussion with GI, it was recommended to hold rivaroxaban if possible--patient is active and ambulates regularly -she will continue ASA and Effient -protonix bid   History of left distal femur fracture -Status post ORIF/nail fixation 05/03/2021 at [facility] -PT evaluation>> home health PT   Ischemic cardiomyopathy/chronic systolic CHF -[id number]/7654 echo EF 45-50%, +WMA -Clinically euvolemic -Continue aspirin, prasugrel, Crestor   Essential hypertension -Continue amlodipine, olmesartan, metoprolol tartrate   Hyperlipidemia -Continue Crestor   Diabetes mellitus type 2, controlled -05/03/2021 hemoglobin A1c 5.8 -Continue metformin   CAD -S/p CABG and mitral valve repair -S/p cath with stent placement Jan 2022 with DES x2  -Hold aspirin but will continue effient since she had a  stent placed < 12 months    Depression/anxiety -Continue Wellbutrin, Valium as  needed   Discharge Instructions   Allergies as of 05/19/2021       Reactions   Codeine Itching, Rash   Hydrocodone Itching, Rash   Lipitor [atorvastatin] Itching, Rash        Medication List     STOP taking these medications    benzonatate 100 MG capsule Commonly known as: TESSALON   rivaroxaban 20 MG Tabs tablet Commonly known as: XARELTO       TAKE these medications    albuterol 108 (90 Base) MCG/ACT inhaler Commonly known as: VENTOLIN HFA Inhale 1-2 puffs into the lungs every 6 (six) hours as needed for wheezing or shortness of breath.   amLODipine 10 MG tablet Commonly known as: NORVASC Take 10 mg by mouth daily.   aspirin EC 81 MG tablet Take 81 mg by mouth daily.   Baclofen 5 MG Tabs Take 5 mg by mouth in the morning, at noon, and at bedtime. 10 day course   buPROPion 150 MG 24 hr tablet Commonly known as: WELLBUTRIN XL Take 150 mg by mouth daily.   cetirizine 10 MG tablet Commonly known as: ZYRTEC Take 10 mg by mouth daily.   colchicine 0.6 MG tablet Take 0.6 mg by mouth daily as needed (flare).   diazepam 2 MG tablet Commonly known as: VALIUM Take 2 mg by mouth every 6 (six) hours as needed for anxiety.   fenofibrate micronized 134 MG capsule Commonly known as: LOFIBRA Take 134 mg by mouth daily before breakfast.   gabapentin 300 MG capsule Commonly known as: NEURONTIN Take 300 mg by mouth 3 (three) times daily.   metFORMIN 1000 MG tablet Commonly known as: GLUCOPHAGE Take 1,000 mg by mouth 2 (two) times daily.   metoprolol tartrate 50 MG tablet Commonly known as: LOPRESSOR Take 12.5 mg by mouth 2 (two) times daily.   multivitamin with minerals Tabs tablet Take 1 tablet by mouth daily.   olmesartan 40 MG tablet Commonly known as: BENICAR Take 20 mg by mouth daily.   oxyCODONE 5 MG immediate release tablet Commonly known as: Oxy IR/ROXICODONE Take 5 mg by mouth every 4 (four) hours as needed for severe pain.   pantoprazole  40 MG tablet Commonly known as: PROTONIX Take 1 tablet (40 mg total) by mouth 2 (two) times daily.   prasugrel 10 MG Tabs tablet Commonly known as: EFFIENT Take 10 mg by mouth daily.   rosuvastatin 20 MG tablet Commonly known as: CRESTOR Take 20 mg by mouth daily.   zolpidem 10 MG tablet Commonly known as: AMBIEN Take 10 mg by mouth at bedtime.        Follow-up Information     [facility] Follow up.   Why: PT will call to schedule your next home visit.               Allergies  Allergen Reactions   Codeine Itching and Rash   Hydrocodone Itching and Rash   Lipitor [Atorvastatin] Itching and Rash    Consultations: GI   Procedures/Studies: CT ANGIO ABDOMEN W &/OR WO CONTRAST  Result Date: 05/18/2021 CLINICAL DATA:  Melena, history of abdominal aortic aneurysm status post repair, rectal bleeding since lower extremity surgery 2 weeks ago EXAM: CTA ABDOMEN AND PELVIS WITHOUT AND WITH CONTRAST TECHNIQUE: Multidetector CT imaging of the abdomen and pelvis was performed using the standard protocol during bolus administration of intravenous contrast. Multiplanar reconstructed images and MIPs were obtained  and reviewed to evaluate the vascular anatomy. CONTRAST:  79mL OMNIPAQUE IOHEXOL 350 MG/ML SOLN COMPARISON:  None. FINDINGS: VASCULAR Aorta: There is moderate atherosclerosis within the suprarenal abdominal aorta. No flow limiting stenosis. There has been an aorto bi-iliac bypass from prior abdominal aneurysm repair, which is widely patent. Celiac: Patent without evidence of aneurysm, dissection, vasculitis or significant stenosis. Mild atherosclerosis at the origin. SMA: There is moderate atheromatous plaque at the origin of the SMA, with stenosis estimated greater than 70%. Remainder of the SMA is widely patent with no evidence of aneurysm, dissection, or vasculitis. Renals: There is significant bilateral renal artery atherosclerosis, stenosis estimated less than 50% however. No  evidence of vasculitis or dissection. IMA: The IMA is not identified after previous aorto bi-iliac bypass. Inflow: Patent without evidence of aneurysm, dissection, vasculitis or significant stenosis. Moderate atherosclerosis without significant stenosis. Proximal Outflow: Bilateral common femoral and visualized portions of the superficial and profunda femoral arteries are patent without evidence of aneurysm, dissection, vasculitis or significant stenosis. Veins: No obvious venous abnormality within the limitations of this arterial phase study. Review of the MIP images confirms the above findings. NON-VASCULAR Lower chest: No acute pleural or parenchymal lung disease. Hepatobiliary: Subtle nodularity of the liver capsule may reflect cirrhosis. No focal liver abnormality is seen. Status post cholecystectomy. No biliary dilatation. Pancreas: Unremarkable. No pancreatic ductal dilatation or surrounding inflammatory changes. Spleen: Normal in size without focal abnormality. Adrenals/Urinary Tract: Small left renal cyst. Otherwise the kidneys enhance normally and symmetrically. No urinary tract calculi or obstruction. The adrenals and bladder are grossly unremarkable. Stomach/Bowel: No bowel obstruction or ileus. No bowel wall thickening or inflammatory change. No intraluminal accumulation of contrast to suggest active gastrointestinal bleeding. Lymphatic: No pathologic adenopathy. Reproductive: Uterus and bilateral adnexa are unremarkable. Other: No free fluid or free gas.  No abdominal wall hernia. Musculoskeletal: No acute or destructive bony lesions. Reconstructed images demonstrate no additional findings. IMPRESSION: VASCULAR 1. Postsurgical changes from prior aorto bi-iliac bypass for AAA repair. 2. 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 The distal SMA is widely patent however. 3. No evidence of active gastrointestinal bleeding. 4.  Aortic Atherosclerosis  (ICD10-I70.0). NON-VASCULAR 1. No acute intra-abdominal or intrapelvic process. 2. Subtle nodularity of the liver capsule which may reflect underlying cirrhosis. Electronically Signed   By: Randa Ngo M.D.   On: 05/18/2021 17:47        Discharge Exam: Vitals:   05/19/21 0223 05/19/21 0526  BP: (!) 148/55 (!) 157/53  Pulse: 66 65  Resp: 17 19  Temp: 98.3 F (36.8 C) 97.8 F (36.6 C)  SpO2: 94% 98%   Vitals:   05/18/21 2045 05/18/21 2124 05/19/21 0223 05/19/21 0526  BP: (!) 144/50 (!) 144/47 (!) 148/55 (!) 157/53  Pulse: 66 64 66 65  Resp: 18 18 17 19   Temp: 98.7 F (37.1 C) 98.8 F (37.1 C) 98.3 F (36.8 C) 97.8 F (36.6 C)  TempSrc: Oral Oral  Oral  SpO2: 98% 99% 94% 98%  Weight:      Height:        General: Pt is alert, awake, not in acute distress Cardiovascular: RRR, S1/S2 +, no rubs, no gallops Respiratory: CTA bilaterally, no wheezing, no rhonchi Abdominal: Soft, NT, ND, bowel sounds + Extremities: no edema, no cyanosis   The results of significant diagnostics from this hospitalization (including imaging, microbiology, ancillary and laboratory) are listed below for reference.    Significant Diagnostic Studies: CT ANGIO  ABDOMEN W &/OR WO CONTRAST  Result Date: 05/18/2021 CLINICAL DATA:  Melena, history of abdominal aortic aneurysm status post repair, rectal bleeding since lower extremity surgery 2 weeks ago EXAM: CTA ABDOMEN AND PELVIS WITHOUT AND WITH CONTRAST TECHNIQUE: Multidetector CT imaging of the abdomen and pelvis was performed using the standard protocol during bolus administration of intravenous contrast. Multiplanar reconstructed images and MIPs were obtained and reviewed to evaluate the vascular anatomy. CONTRAST:  10mL OMNIPAQUE IOHEXOL 350 MG/ML SOLN COMPARISON:  None. FINDINGS: VASCULAR Aorta: There is moderate atherosclerosis within the suprarenal abdominal aorta. No flow limiting stenosis. There has been an aorto bi-iliac bypass from prior  abdominal aneurysm repair, which is widely patent. Celiac: Patent without evidence of aneurysm, dissection, vasculitis or significant stenosis. Mild atherosclerosis at the origin. SMA: There is moderate atheromatous plaque at the origin of the SMA, with stenosis estimated greater than 70%. Remainder of the SMA is widely patent with no evidence of aneurysm, dissection, or vasculitis. Renals: There is significant bilateral renal artery atherosclerosis, stenosis estimated less than 50% however. No evidence of vasculitis or dissection. IMA: The IMA is not identified after previous aorto bi-iliac bypass. Inflow: Patent without evidence of aneurysm, dissection, vasculitis or significant stenosis. Moderate atherosclerosis without significant stenosis. Proximal Outflow: Bilateral common femoral and visualized portions of the superficial and profunda femoral arteries are patent without evidence of aneurysm, dissection, vasculitis or significant stenosis. Veins: No obvious venous abnormality within the limitations of this arterial phase study. Review of the MIP images confirms the above findings. NON-VASCULAR Lower chest: No acute pleural or parenchymal lung disease. Hepatobiliary: Subtle nodularity of the liver capsule may reflect cirrhosis. No focal liver abnormality is seen. Status post cholecystectomy. No biliary dilatation. Pancreas: Unremarkable. No pancreatic ductal dilatation or surrounding inflammatory changes. Spleen: Normal in size without focal abnormality. Adrenals/Urinary Tract: Small left renal cyst. Otherwise the kidneys enhance normally and symmetrically. No urinary tract calculi or obstruction. The adrenals and bladder are grossly unremarkable. Stomach/Bowel: No bowel obstruction or ileus. No bowel wall thickening or inflammatory change. No intraluminal accumulation of contrast to suggest active gastrointestinal bleeding. Lymphatic: No pathologic adenopathy. Reproductive: Uterus and bilateral adnexa are  unremarkable. Other: No free fluid or free gas.  No abdominal wall hernia. Musculoskeletal: No acute or destructive bony lesions. Reconstructed images demonstrate no additional findings. IMPRESSION: VASCULAR 1. Postsurgical changes from prior aorto bi-iliac bypass for AAA repair. 2. 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 The distal SMA is widely patent however. 3. No evidence of active gastrointestinal bleeding. 4.  Aortic Atherosclerosis (ICD10-I70.0). NON-VASCULAR 1. No acute intra-abdominal or intrapelvic process. 2. Subtle nodularity of the liver capsule which may reflect underlying cirrhosis. Electronically Signed   By: Randa Ngo M.D.   On: 05/18/2021 17:47    Microbiology: Recent Results (from the past 240 hour(s))  Resp Panel by RT-PCR (Flu A&B, Covid) Nasopharyngeal Swab     Status: None   Collection Time: 05/18/21  3:13 PM   Specimen: Nasopharyngeal Swab; Nasopharyngeal(NP) swabs in vial transport medium  Result Value Ref Range Status   SARS Coronavirus 2 by RT PCR NEGATIVE NEGATIVE Final    Comment: (NOTE) SARS-CoV-2 target nucleic acids are NOT DETECTED.  The SARS-CoV-2 RNA is generally detectable in upper respiratory specimens during the acute phase of infection. The lowest concentration of SARS-CoV-2 viral copies this assay can detect is 138 copies/mL. A negative result does not preclude SARS-Cov-2 infection and should not be used as the sole  basis for treatment or other patient management decisions. A negative result may occur with  improper specimen collection/handling, submission of specimen other than nasopharyngeal swab, presence of viral mutation(s) within the areas targeted by this assay, and inadequate number of viral copies(<138 copies/mL). A negative result must be combined with clinical observations, patient history, and epidemiological information. The expected result is Negative.  Fact Sheet for Patients:   EntrepreneurPulse.com.au  Fact Sheet for Healthcare Providers:  IncredibleEmployment.be  This test is no t yet approved or cleared by the [place] FDA and  has been authorized for detection and/or diagnosis of SARS-CoV-2 by FDA under an Emergency Use Authorization (EUA). This EUA will remain  in effect (meaning this test can be used) for the duration of the COVID-19 declaration under Section 564(b)(1) of the Act, 21 U.S.C.section 360bbb-3(b)(1), unless the authorization is terminated  or revoked sooner.       Influenza A by PCR NEGATIVE NEGATIVE Final   Influenza B by PCR NEGATIVE NEGATIVE Final    Comment: (NOTE) The Xpert Xpress SARS-CoV-2/FLU/RSV plus assay is intended as an aid in the diagnosis of influenza from Nasopharyngeal swab specimens and should not be used as a sole basis for treatment. Nasal washings and aspirates are unacceptable for Xpert Xpress SARS-CoV-2/FLU/RSV testing.  Fact Sheet for Patients: EntrepreneurPulse.com.au  Fact Sheet for Healthcare Providers: IncredibleEmployment.be  This test is not yet approved or cleared by the [place] FDA and has been authorized for detection and/or diagnosis of SARS-CoV-2 by FDA under an Emergency Use Authorization (EUA). This EUA will remain in effect (meaning this test can be used) for the duration of the COVID-19 declaration under Section 564(b)(1) of the Act, 21 U.S.C. section 360bbb-3(b)(1), unless the authorization is terminated or revoked.  Performed at [facility], [address] [place], [place] [address]      Labs: Basic Metabolic Panel: Recent Labs  Lab 05/18/21 1156  NA 141  K 3.8  CL 109  CO2 24  GLUCOSE 190*  BUN 24*  CREATININE 1.04*  CALCIUM 9.6   Liver Function Tests: Recent Labs  Lab 05/18/21 1156  AST 48*  ALT 22  ALKPHOS 69  BILITOT 0.8  PROT 6.5  ALBUMIN 3.6   No results for input(s): LIPASE,  AMYLASE in the last 168 hours. No results for input(s): AMMONIA in the last 168 hours. CBC: Recent Labs  Lab 05/18/21 1156 05/19/21 0512  WBC 7.8 5.2  HGB 5.9* 9.0*  8.9*  HCT 20.4* 29.2*  28.9*  MCV 103.0* 93.9  PLT 353 247   Cardiac Enzymes: No results for input(s): CKTOTAL, CKMB, CKMBINDEX, TROPONINI in the last 168 hours. BNP: Invalid input(s): POCBNP CBG: Recent Labs  Lab 05/18/21 2010 05/18/21 2359 05/19/21 0413 05/19/21 0659  GLUCAP 89 87 80 84    Time coordinating discharge:  36 minutes  Signed:  Orson Eva, DO [facility] Pager: [phone] 05/19/2021, 10:13 AM

## 2021-05-19 NOTE — TOC Transition Note (Signed)
Transition of Care [facility]) - CM/SW Discharge Note   Patient Details  Name: Alicia Ortega MRN: [id number] Date of Birth: 1951-05-10  [facility]) CM/SW Contact:  Boneta Lucks, RN Phone Number: 05/19/2021, 10:10 AM   Clinical Narrative:   Patient admitted in OBS with anemia. Discharging home today. Patient is active with [facility]. Eric updated, orders placed.   Final next level of care: [place] Barriers to Discharge: Barriers Resolved   Patient Goals and CMS Choice Patient states their goals for this hospitalization and ongoing recovery are:: to go home. CMS Medicare.gov Compare Post Acute Care list provided to:: Patient Choice offered to / list presented to : Patient  Discharge Placement               Patient and family notified of of transfer: 05/19/21  Discharge Plan and [place] [facility]

## 2021-05-19 NOTE — Progress Notes (Signed)
Subjective:  Patient has no complaints.  She has not had bowel movement since she came to emergency room yesterday.  She is hungry.  Her appetite is normal.  She denies abdominal pain.  She wants to be treated like she has peptic ulcer disease and hold off any work-up.  Current Medications:  Current Facility-Administered Medications:    0.9 %  sodium chloride infusion, 10 mL/hr, Intravenous, Once, [employer], DO   0.9 %  sodium chloride infusion, 250 mL, Intravenous, PRN, Dessa Phi, DO   acetaminophen (TYLENOL) tablet 650 mg, 650 mg, Oral, Q6H PRN **OR** acetaminophen (TYLENOL) suppository 650 mg, 650 mg, Rectal, Q6H PRN, Dessa Phi, DO   aspirin EC tablet 81 mg, 81 mg, Oral, Daily, Tat, David, MD   buPROPion (WELLBUTRIN XL) 24 hr tablet 150 mg, 150 mg, Oral, Daily, Dessa Phi, DO, 150 mg at 05/19/21 0847   diazepam (VALIUM) tablet 2 mg, 2 mg, Oral, Q6H PRN, Dessa Phi, DO   insulin aspart (novoLOG) injection 0-9 Units, 0-9 Units, Subcutaneous, Q4H, Dessa Phi, DO   metoprolol tartrate (LOPRESSOR) tablet 12.5 mg, 12.5 mg, Oral, BID, Dessa Phi, DO, 12.5 mg at 05/19/21 0848   ondansetron (ZOFRAN) tablet 4 mg, 4 mg, Oral, Q6H PRN **OR** ondansetron (ZOFRAN) injection 4 mg, 4 mg, Intravenous, Q6H PRN, Dessa Phi, DO   pantoprazole (PROTONIX) EC tablet 40 mg, 40 mg, Oral, BID, Tat, David, MD   prasugrel (EFFIENT) tablet 10 mg, 10 mg, Oral, Daily, Dessa Phi, DO   rosuvastatin (CRESTOR) tablet 20 mg, 20 mg, Oral, Daily, Dessa Phi, DO, 20 mg at 05/19/21 0848   sodium chloride flush (NS) 0.9 % injection 3 mL, 3 mL, Intravenous, Q12H, Dessa Phi, DO   sodium chloride flush (NS) 0.9 % injection 3 mL, 3 mL, Intravenous, PRN, Dessa Phi, DO   zolpidem (AMBIEN) tablet 10 mg, 10 mg, Oral, QHS, Dessa Phi, DO, 10 mg at 05/18/21 2228  Objective: Blood pressure (!) 157/53, pulse 65, temperature 97.8 F (36.6 C), temperature source Oral, resp. rate 19,  height _0  (1.575 m), weight 53.6 kg, SpO2 98 %. Patient is alert and in no acute distress. Will not appear as pale as she did yesterday. Abdomen is full but soft and nontender with organomegaly or masses. No LE edema or clubbing noted.  Labs/studies Results:   CBC Latest Ref Rng & Units 05/19/2021 05/19/2021 05/18/2021  WBC 4.0 - 10.5 K/uL 5.2 - 7.8  Hemoglobin 12.0 - 15.0 g/dL 9.0(L) 8.9(L) 5.9(LL)  Hematocrit 36.0 - 46.0 % 29.2(L) 28.9(L) 20.4(L)  Platelets 150 - 400 K/uL 247 - 353    CMP Latest Ref Rng & Units 05/18/2021 06/19/2017 10/23/2013  Glucose 70 - 99 mg/dL 190(H) 120(H) 227(H)  BUN 8 - 23 mg/dL 24(H) 20 12  Creatinine 0.44 - 1.00 mg/dL 1.04(H) 0.86 0.57  Sodium 135 - 145 mmol/L 141 138 139  Potassium 3.5 - 5.1 mmol/L 3.8 3.8 3.6(L)  Chloride 98 - 111 mmol/L 109 107 101  CO2 22 - 32 mmol/L _1 Calcium 8.9 - 10.3 mg/dL 9.6 9.4 8.7  Total Protein 6.5 - 8.1 g/dL 6.5 - 7.1  Total Bilirubin 0.3 - 1.2 mg/dL 0.8 - 0.6  Alkaline Phos 38 - 126 U/L 69 - 40  AST 15 - 41 U/L 48(H) - 29  ALT 0 - 44 U/L 22 - 37(H)    Hepatic Function Latest Ref Rng & Units 05/18/2021 10/23/2013 10/02/2009  Total Protein 6.5 - 8.1 g/dL 6.5 7.1 6.6  Albumin 3.5 - 5.0 g/dL 3.6 3.6 3.9  AST 15 - 41 U/L 48(H) 29 24  ALT 0 - 44 U/L 22 37(H) 43(H)  Alk Phosphatase 38 - 126 U/L 69 40 69  Total Bilirubin 0.3 - 1.2 mg/dL 0.8 0.6 0.7    CT abdomen and pelvis No abnormalities suggest aortoenteric fistula. Postsurgical changes from prior aortobiiliac bypass for abdominal aortic aneurysm. Narrowing to origin of SMA greater than 70%.  Distal SMA is patent.  Celiac trunk patent as well.  Assessment:  #1.  Anemia.  Multifactorial anemia.  Her hemoglobin was less than 8 g when she left the hospital for 2 weeks ago after surgery for left femur fracture.  Further drop appears to be due to GI bleed as she experienced melena few days prior to coming the hospital.  She has received 2 units of PRBCs and feels  better.  Patient not interested in further work-up at this time.  #2.  History of of coronary artery disease with coronary stenting in January this year.  Choice of antiplatelet and anticoagulant therapy per Dr. Ron Parker.   Recommendations  Discharge on pantoprazole 40 mg p.o. twice daily Patient provided with 1 Hemoccult which she will bring to the office on 05/28/2021 when she would also have her H&H checked. Dr. Carles Collet to make recommendations regarding antiplatelet and anticoagulation therapy. Would prefer she is on 2 agents rather than 3. Patient advised to report to emergency room if she has melena or rectal bleeding or postural symptoms.

## 2021-05-19 NOTE — Evaluation (Signed)
Physical Therapy Evaluation Patient Details Name: Alicia Ortega MRN: [id number] DOB: [birth year] Today's Date: 05/19/2021  History of Present Illness  Alicia Ortega is a 70 y.o. female with medical history significant of DM and CAD, AAA who recently underwent distal femur fracture repair on 10/6. On discharge, she was put on Xarelto for DVT prophylaxis. Her baseline hgb is around 11. She followed up with her PCP and was found to have hgb down to 5.7. She admits to dizziness and lightheadedness.  She denies any chest pain or shortness of breath, no nausea, vomiting or abdominal pain.  She denies any syncopal episodes.  She had a colonoscopy in 2017 with Dr. Laural Golden which revealed external hemorrhoids, normal colon.  She has never had an EGD.  She denies taking any over-the-counter ibuprofen, has been taking Tylenol for her leg pain   Clinical Impression   Patient able to demonstrate adequate strength for BLE and ability to perform bed mobility and functional transfers at an independent level and with good safety awareness.  Ambulation performed with RW and supervision for safety demonstrating safe use of RW for negotiating in hospital room and through doorways with mild gait deviations related to LLE weakness/post-op discomfort from recent hx of femur fracture.  Able to ascend/descend stair with HR and steadying assist, no LOB.  Reduced functional activity tolerance evident requiring seated rest period after ambulation x 50 ft.  Pt reports she has ortho f/u for left femur and has been scheduled to start HHPT/OT at the start of this next week should she be able to return home.  Has requisite DME and family/friend assistance in place.  PT needs can be met in the next level of care.        Recommendations for follow up therapy are one component of a multi-disciplinary discharge planning process, led by the attending physician.  Recommendations may be updated based on patient status, additional functional  criteria and insurance authorization.  Follow Up Recommendations Home health PT    Equipment Recommendations  None recommended by PT    Recommendations for Other Services       Precautions / Restrictions Restrictions Weight Bearing Restrictions: Yes LLE Weight Bearing: Weight bearing as tolerated (per patient report, WBAT with RW from surgery on 05/03/21)      Mobility  Bed Mobility Overal bed mobility: Independent                  Transfers Overall transfer level: Modified independent Equipment used: Rolling walker (2 wheeled)                Ambulation/Gait Ambulation/Gait assistance: Supervision Gait Distance (Feet): 50 Feet Assistive device: Rolling walker (2 wheeled) Gait Pattern/deviations: Step-to pattern Gait velocity: decreased   General Gait Details: some LLE antalgia, decreased weight acceptance  Stairs Stairs: Yes Stairs assistance: Min guard Stair Management: Two rails Number of Stairs: 1    Wheelchair Mobility    Modified Rankin (Stroke Patients Only)       Balance Overall balance assessment: History of Falls;Modified Independent                                           Pertinent Vitals/Pain Pain Assessment: 0-10 Pain Score: 2  Pain Location: left thigh, surgical area Pain Descriptors / Indicators: Sore Pain Intervention(s): Limited activity within patient's tolerance;Monitored during session    Home Living Family/patient  expects to be discharged to:: Private residence Living Arrangements: Alone Available Help at Discharge: Family;Friend(s) Type of Home: House Home Access: [place]: One [place]: Walker - 2 wheels;Hand held shower head;Shower seat      Prior Function Level of Independence: Independent;Independent with assistive device(s)         Comments: prior to fall and LLE surgery pt was independent, using RW for transfers and gait since 05/03/21      Hand Dominance        Extremity/Trunk Assessment   Upper Extremity Assessment Upper Extremity Assessment: Overall WFL for tasks assessed    Lower Extremity Assessment Lower Extremity Assessment: Overall WFL for tasks assessed       Communication   Communication: No difficulties  Cognition Arousal/Alertness: Awake/alert Behavior During Therapy: WFL for tasks assessed/performed Overall Cognitive Status: Within Functional Limits for tasks assessed                                        General Comments      Exercises     Assessment/Plan    PT Assessment All further PT needs can be met in the next venue of care  PT Problem List Decreased strength;Decreased mobility;Decreased activity tolerance;Decreased balance       PT Treatment Interventions      PT Goals (Current goals can be found in the Care Plan section)  Acute Rehab PT Goals Patient Stated Goal: Return home PT Goal Formulation: With patient Time For Goal Achievement: 05/19/21 Potential to Achieve Goals: Good    Frequency     Barriers to discharge        Co-evaluation               AM-PAC PT "6 Clicks" Mobility  Outcome Measure Help needed turning from your back to your side while in a flat bed without using bedrails?: None Help needed moving from lying on your back to sitting on the side of a flat bed without using bedrails?: None Help needed moving to and from a bed to a chair (including a wheelchair)?: None Help needed standing up from a chair using your arms (e.g., wheelchair or bedside chair)?: None Help needed to walk in hospital room?: A Little Help needed climbing 3-5 steps with a railing? : A Lot 6 Click Score: 21    End of Session Equipment Utilized During Treatment: Gait belt Activity Tolerance: Patient tolerated treatment well Patient left: in bed;with bed alarm set Nurse Communication: Mobility status PT Visit Diagnosis: Difficulty in walking, not  elsewhere classified (R26.2);History of falling (Z91.81)    Time: 3734-2876 PT Time Calculation (min) (ACUTE ONLY): 19 min   Charges:   PT Evaluation $PT Eval Low Complexity: 1 Low PT Treatments $Gait Training: 8-22 mins      8:43 AM, 05/19/21 M. Sherlyn Lees, PT, DPT Physical Therapist- [place] Office Number: [phone]

## 2021-05-20 LAB — TYPE AND SCREEN
ABO/RH(D): A POS
Antibody Screen: NEGATIVE
Unit division: 0
Unit division: 0

## 2021-05-20 LAB — BPAM RBC
Blood Product Expiration Date: 202211152359
Blood Product Expiration Date: 202211152359
ISSUE DATE / TIME: 202210211424
ISSUE DATE / TIME: 202210212105
Unit Type and Rh: 6200
Unit Type and Rh: 6200

## 2021-05-21 DIAGNOSIS — Z885 Allergy status to narcotic agent status: Secondary | ICD-10-CM | POA: Diagnosis not present

## 2021-05-21 DIAGNOSIS — I10 Essential (primary) hypertension: Secondary | ICD-10-CM | POA: Diagnosis not present

## 2021-05-21 DIAGNOSIS — S7292XD Unspecified fracture of left femur, subsequent encounter for closed fracture with routine healing: Secondary | ICD-10-CM | POA: Diagnosis not present

## 2021-05-21 DIAGNOSIS — Z7984 Long term (current) use of oral hypoglycemic drugs: Secondary | ICD-10-CM | POA: Diagnosis not present

## 2021-05-21 DIAGNOSIS — Z79899 Other long term (current) drug therapy: Secondary | ICD-10-CM | POA: Diagnosis not present

## 2021-05-21 DIAGNOSIS — Z7902 Long term (current) use of antithrombotics/antiplatelets: Secondary | ICD-10-CM | POA: Diagnosis not present

## 2021-05-21 DIAGNOSIS — E119 Type 2 diabetes mellitus without complications: Secondary | ICD-10-CM | POA: Diagnosis not present

## 2021-05-21 DIAGNOSIS — Z7982 Long term (current) use of aspirin: Secondary | ICD-10-CM | POA: Diagnosis not present

## 2021-05-21 DIAGNOSIS — I2510 Atherosclerotic heart disease of native coronary artery without angina pectoris: Secondary | ICD-10-CM | POA: Diagnosis not present

## 2021-05-21 DIAGNOSIS — S79102A Unspecified physeal fracture of lower end of left femur, initial encounter for closed fracture: Secondary | ICD-10-CM | POA: Diagnosis not present

## 2021-05-21 DIAGNOSIS — S72362D Displaced segmental fracture of shaft of left femur, subsequent encounter for closed fracture with routine healing: Secondary | ICD-10-CM | POA: Diagnosis not present

## 2021-05-21 DIAGNOSIS — S72362A Displaced segmental fracture of shaft of left femur, initial encounter for closed fracture: Secondary | ICD-10-CM | POA: Diagnosis not present

## 2021-05-21 LAB — HEMOGLOBIN A1C
Hgb A1c MFr Bld: 5.1 % (ref 4.8–5.6)
Mean Plasma Glucose: 100 mg/dL

## 2021-05-21 NOTE — TOC Transition Note (Signed)
HH orders and D/C summary faxed to [facility]

## 2021-05-28 DIAGNOSIS — K621 Rectal polyp: Secondary | ICD-10-CM | POA: Diagnosis not present

## 2021-05-28 DIAGNOSIS — K635 Polyp of colon: Secondary | ICD-10-CM | POA: Diagnosis not present

## 2021-05-28 DIAGNOSIS — I959 Hypotension, unspecified: Secondary | ICD-10-CM | POA: Diagnosis not present

## 2021-05-28 DIAGNOSIS — E1165 Type 2 diabetes mellitus with hyperglycemia: Secondary | ICD-10-CM | POA: Diagnosis not present

## 2021-05-28 DIAGNOSIS — K922 Gastrointestinal hemorrhage, unspecified: Secondary | ICD-10-CM | POA: Diagnosis not present

## 2021-05-28 DIAGNOSIS — I110 Hypertensive heart disease with heart failure: Secondary | ICD-10-CM | POA: Diagnosis not present

## 2021-05-28 DIAGNOSIS — R932 Abnormal findings on diagnostic imaging of liver and biliary tract: Secondary | ICD-10-CM | POA: Diagnosis not present

## 2021-05-28 DIAGNOSIS — K921 Melena: Secondary | ICD-10-CM | POA: Diagnosis not present

## 2021-05-28 DIAGNOSIS — R410 Disorientation, unspecified: Secondary | ICD-10-CM | POA: Diagnosis not present

## 2021-05-28 DIAGNOSIS — D120 Benign neoplasm of cecum: Secondary | ICD-10-CM | POA: Diagnosis not present

## 2021-05-28 DIAGNOSIS — Z87891 Personal history of nicotine dependence: Secondary | ICD-10-CM | POA: Diagnosis not present

## 2021-05-28 DIAGNOSIS — K7460 Unspecified cirrhosis of liver: Secondary | ICD-10-CM | POA: Diagnosis not present

## 2021-05-28 DIAGNOSIS — Z951 Presence of aortocoronary bypass graft: Secondary | ICD-10-CM | POA: Diagnosis not present

## 2021-05-28 DIAGNOSIS — E1151 Type 2 diabetes mellitus with diabetic peripheral angiopathy without gangrene: Secondary | ICD-10-CM | POA: Diagnosis not present

## 2021-05-28 DIAGNOSIS — J90 Pleural effusion, not elsewhere classified: Secondary | ICD-10-CM | POA: Diagnosis not present

## 2021-05-28 DIAGNOSIS — R710 Precipitous drop in hematocrit: Secondary | ICD-10-CM | POA: Diagnosis not present

## 2021-05-28 DIAGNOSIS — I219 Acute myocardial infarction, unspecified: Secondary | ICD-10-CM | POA: Diagnosis not present

## 2021-05-28 DIAGNOSIS — E440 Moderate protein-calorie malnutrition: Secondary | ICD-10-CM | POA: Diagnosis not present

## 2021-05-28 DIAGNOSIS — J449 Chronic obstructive pulmonary disease, unspecified: Secondary | ICD-10-CM | POA: Diagnosis not present

## 2021-05-28 DIAGNOSIS — E876 Hypokalemia: Secondary | ICD-10-CM | POA: Diagnosis not present

## 2021-05-28 DIAGNOSIS — K766 Portal hypertension: Secondary | ICD-10-CM | POA: Diagnosis not present

## 2021-05-28 DIAGNOSIS — K7689 Other specified diseases of liver: Secondary | ICD-10-CM | POA: Diagnosis not present

## 2021-05-28 DIAGNOSIS — Z6821 Body mass index (BMI) 21.0-21.9, adult: Secondary | ICD-10-CM | POA: Diagnosis not present

## 2021-05-28 DIAGNOSIS — I255 Ischemic cardiomyopathy: Secondary | ICD-10-CM | POA: Diagnosis not present

## 2021-05-28 DIAGNOSIS — I5022 Chronic systolic (congestive) heart failure: Secondary | ICD-10-CM | POA: Diagnosis not present

## 2021-05-28 DIAGNOSIS — Z6820 Body mass index (BMI) 20.0-20.9, adult: Secondary | ICD-10-CM | POA: Diagnosis not present

## 2021-05-28 DIAGNOSIS — Z7984 Long term (current) use of oral hypoglycemic drugs: Secondary | ICD-10-CM | POA: Diagnosis not present

## 2021-05-28 DIAGNOSIS — I2510 Atherosclerotic heart disease of native coronary artery without angina pectoris: Secondary | ICD-10-CM | POA: Diagnosis not present

## 2021-05-28 DIAGNOSIS — R42 Dizziness and giddiness: Secondary | ICD-10-CM | POA: Diagnosis not present

## 2021-05-28 DIAGNOSIS — Q438 Other specified congenital malformations of intestine: Secondary | ICD-10-CM | POA: Diagnosis not present

## 2021-05-28 DIAGNOSIS — I10 Essential (primary) hypertension: Secondary | ICD-10-CM | POA: Diagnosis not present

## 2021-05-28 DIAGNOSIS — I5020 Unspecified systolic (congestive) heart failure: Secondary | ICD-10-CM | POA: Diagnosis not present

## 2021-05-28 DIAGNOSIS — E785 Hyperlipidemia, unspecified: Secondary | ICD-10-CM | POA: Diagnosis not present

## 2021-05-28 DIAGNOSIS — I252 Old myocardial infarction: Secondary | ICD-10-CM | POA: Diagnosis not present

## 2021-05-28 DIAGNOSIS — Z20822 Contact with and (suspected) exposure to covid-19: Secondary | ICD-10-CM | POA: Diagnosis not present

## 2021-05-28 DIAGNOSIS — J302 Other seasonal allergic rhinitis: Secondary | ICD-10-CM | POA: Diagnosis not present

## 2021-05-28 DIAGNOSIS — D62 Acute posthemorrhagic anemia: Secondary | ICD-10-CM | POA: Diagnosis not present

## 2021-05-29 DIAGNOSIS — D62 Acute posthemorrhagic anemia: Secondary | ICD-10-CM | POA: Diagnosis not present

## 2021-06-06 DIAGNOSIS — I10 Essential (primary) hypertension: Secondary | ICD-10-CM | POA: Diagnosis not present

## 2021-06-06 DIAGNOSIS — D649 Anemia, unspecified: Secondary | ICD-10-CM | POA: Diagnosis not present

## 2021-06-06 DIAGNOSIS — S7290XD Unspecified fracture of unspecified femur, subsequent encounter for closed fracture with routine healing: Secondary | ICD-10-CM | POA: Diagnosis not present

## 2021-06-06 DIAGNOSIS — E1165 Type 2 diabetes mellitus with hyperglycemia: Secondary | ICD-10-CM | POA: Diagnosis not present

## 2021-06-07 DIAGNOSIS — Z7901 Long term (current) use of anticoagulants: Secondary | ICD-10-CM | POA: Diagnosis not present

## 2021-06-07 DIAGNOSIS — Z9181 History of falling: Secondary | ICD-10-CM | POA: Diagnosis not present

## 2021-06-07 DIAGNOSIS — E1151 Type 2 diabetes mellitus with diabetic peripheral angiopathy without gangrene: Secondary | ICD-10-CM | POA: Diagnosis not present

## 2021-06-07 DIAGNOSIS — Z87891 Personal history of nicotine dependence: Secondary | ICD-10-CM | POA: Diagnosis not present

## 2021-06-07 DIAGNOSIS — I2510 Atherosclerotic heart disease of native coronary artery without angina pectoris: Secondary | ICD-10-CM | POA: Diagnosis not present

## 2021-06-07 DIAGNOSIS — F32A Depression, unspecified: Secondary | ICD-10-CM | POA: Diagnosis not present

## 2021-06-07 DIAGNOSIS — I10 Essential (primary) hypertension: Secondary | ICD-10-CM | POA: Diagnosis not present

## 2021-06-07 DIAGNOSIS — Z955 Presence of coronary angioplasty implant and graft: Secondary | ICD-10-CM | POA: Diagnosis not present

## 2021-06-07 DIAGNOSIS — E782 Mixed hyperlipidemia: Secondary | ICD-10-CM | POA: Diagnosis not present

## 2021-06-07 DIAGNOSIS — S72332D Displaced oblique fracture of shaft of left femur, subsequent encounter for closed fracture with routine healing: Secondary | ICD-10-CM | POA: Diagnosis not present

## 2021-06-07 DIAGNOSIS — D649 Anemia, unspecified: Secondary | ICD-10-CM | POA: Diagnosis not present

## 2021-06-11 DIAGNOSIS — I4891 Unspecified atrial fibrillation: Secondary | ICD-10-CM | POA: Diagnosis not present

## 2021-06-11 DIAGNOSIS — Z7984 Long term (current) use of oral hypoglycemic drugs: Secondary | ICD-10-CM | POA: Diagnosis not present

## 2021-06-11 DIAGNOSIS — Z7982 Long term (current) use of aspirin: Secondary | ICD-10-CM | POA: Diagnosis not present

## 2021-06-11 DIAGNOSIS — W19XXXD Unspecified fall, subsequent encounter: Secondary | ICD-10-CM | POA: Diagnosis not present

## 2021-06-11 DIAGNOSIS — E782 Mixed hyperlipidemia: Secondary | ICD-10-CM | POA: Diagnosis not present

## 2021-06-11 DIAGNOSIS — I255 Ischemic cardiomyopathy: Secondary | ICD-10-CM | POA: Diagnosis not present

## 2021-06-11 DIAGNOSIS — I739 Peripheral vascular disease, unspecified: Secondary | ICD-10-CM | POA: Diagnosis not present

## 2021-06-11 DIAGNOSIS — K921 Melena: Secondary | ICD-10-CM | POA: Diagnosis not present

## 2021-06-11 DIAGNOSIS — Z951 Presence of aortocoronary bypass graft: Secondary | ICD-10-CM | POA: Diagnosis not present

## 2021-06-11 DIAGNOSIS — I2510 Atherosclerotic heart disease of native coronary artery without angina pectoris: Secondary | ICD-10-CM | POA: Diagnosis not present

## 2021-06-11 DIAGNOSIS — F32A Depression, unspecified: Secondary | ICD-10-CM | POA: Diagnosis not present

## 2021-06-11 DIAGNOSIS — Z7901 Long term (current) use of anticoagulants: Secondary | ICD-10-CM | POA: Diagnosis not present

## 2021-06-11 DIAGNOSIS — Z7902 Long term (current) use of antithrombotics/antiplatelets: Secondary | ICD-10-CM | POA: Diagnosis not present

## 2021-06-11 DIAGNOSIS — Z794 Long term (current) use of insulin: Secondary | ICD-10-CM | POA: Diagnosis not present

## 2021-06-11 DIAGNOSIS — D62 Acute posthemorrhagic anemia: Secondary | ICD-10-CM | POA: Diagnosis not present

## 2021-06-11 DIAGNOSIS — J449 Chronic obstructive pulmonary disease, unspecified: Secondary | ICD-10-CM | POA: Diagnosis not present

## 2021-06-11 DIAGNOSIS — Z955 Presence of coronary angioplasty implant and graft: Secondary | ICD-10-CM | POA: Diagnosis not present

## 2021-06-11 DIAGNOSIS — Z87891 Personal history of nicotine dependence: Secondary | ICD-10-CM | POA: Diagnosis not present

## 2021-06-11 DIAGNOSIS — S72332D Displaced oblique fracture of shaft of left femur, subsequent encounter for closed fracture with routine healing: Secondary | ICD-10-CM | POA: Diagnosis not present

## 2021-06-11 DIAGNOSIS — I110 Hypertensive heart disease with heart failure: Secondary | ICD-10-CM | POA: Diagnosis not present

## 2021-06-11 DIAGNOSIS — E1151 Type 2 diabetes mellitus with diabetic peripheral angiopathy without gangrene: Secondary | ICD-10-CM | POA: Diagnosis not present

## 2021-06-11 DIAGNOSIS — I5022 Chronic systolic (congestive) heart failure: Secondary | ICD-10-CM | POA: Diagnosis not present

## 2021-06-11 DIAGNOSIS — R001 Bradycardia, unspecified: Secondary | ICD-10-CM | POA: Diagnosis not present

## 2021-06-13 DIAGNOSIS — F32A Depression, unspecified: Secondary | ICD-10-CM | POA: Diagnosis not present

## 2021-06-13 DIAGNOSIS — I5022 Chronic systolic (congestive) heart failure: Secondary | ICD-10-CM | POA: Diagnosis not present

## 2021-06-13 DIAGNOSIS — I255 Ischemic cardiomyopathy: Secondary | ICD-10-CM | POA: Diagnosis not present

## 2021-06-13 DIAGNOSIS — W19XXXD Unspecified fall, subsequent encounter: Secondary | ICD-10-CM | POA: Diagnosis not present

## 2021-06-13 DIAGNOSIS — Z7982 Long term (current) use of aspirin: Secondary | ICD-10-CM | POA: Diagnosis not present

## 2021-06-13 DIAGNOSIS — E1151 Type 2 diabetes mellitus with diabetic peripheral angiopathy without gangrene: Secondary | ICD-10-CM | POA: Diagnosis not present

## 2021-06-13 DIAGNOSIS — J449 Chronic obstructive pulmonary disease, unspecified: Secondary | ICD-10-CM | POA: Diagnosis not present

## 2021-06-13 DIAGNOSIS — Z951 Presence of aortocoronary bypass graft: Secondary | ICD-10-CM | POA: Diagnosis not present

## 2021-06-13 DIAGNOSIS — E782 Mixed hyperlipidemia: Secondary | ICD-10-CM | POA: Diagnosis not present

## 2021-06-13 DIAGNOSIS — K921 Melena: Secondary | ICD-10-CM | POA: Diagnosis not present

## 2021-06-13 DIAGNOSIS — Z794 Long term (current) use of insulin: Secondary | ICD-10-CM | POA: Diagnosis not present

## 2021-06-13 DIAGNOSIS — I110 Hypertensive heart disease with heart failure: Secondary | ICD-10-CM | POA: Diagnosis not present

## 2021-06-13 DIAGNOSIS — S72332D Displaced oblique fracture of shaft of left femur, subsequent encounter for closed fracture with routine healing: Secondary | ICD-10-CM | POA: Diagnosis not present

## 2021-06-13 DIAGNOSIS — I4891 Unspecified atrial fibrillation: Secondary | ICD-10-CM | POA: Diagnosis not present

## 2021-06-13 DIAGNOSIS — Z7984 Long term (current) use of oral hypoglycemic drugs: Secondary | ICD-10-CM | POA: Diagnosis not present

## 2021-06-13 DIAGNOSIS — Z7901 Long term (current) use of anticoagulants: Secondary | ICD-10-CM | POA: Diagnosis not present

## 2021-06-13 DIAGNOSIS — D62 Acute posthemorrhagic anemia: Secondary | ICD-10-CM | POA: Diagnosis not present

## 2021-06-13 DIAGNOSIS — R001 Bradycardia, unspecified: Secondary | ICD-10-CM | POA: Diagnosis not present

## 2021-06-13 DIAGNOSIS — I739 Peripheral vascular disease, unspecified: Secondary | ICD-10-CM | POA: Diagnosis not present

## 2021-06-13 DIAGNOSIS — Z7902 Long term (current) use of antithrombotics/antiplatelets: Secondary | ICD-10-CM | POA: Diagnosis not present

## 2021-06-13 DIAGNOSIS — Z87891 Personal history of nicotine dependence: Secondary | ICD-10-CM | POA: Diagnosis not present

## 2021-06-13 DIAGNOSIS — I2510 Atherosclerotic heart disease of native coronary artery without angina pectoris: Secondary | ICD-10-CM | POA: Diagnosis not present

## 2021-06-13 DIAGNOSIS — Z955 Presence of coronary angioplasty implant and graft: Secondary | ICD-10-CM | POA: Diagnosis not present

## 2021-06-14 DIAGNOSIS — S72362A Displaced segmental fracture of shaft of left femur, initial encounter for closed fracture: Secondary | ICD-10-CM | POA: Diagnosis not present

## 2021-06-14 DIAGNOSIS — S72362D Displaced segmental fracture of shaft of left femur, subsequent encounter for closed fracture with routine healing: Secondary | ICD-10-CM | POA: Diagnosis not present

## 2021-06-18 DIAGNOSIS — J449 Chronic obstructive pulmonary disease, unspecified: Secondary | ICD-10-CM | POA: Diagnosis not present

## 2021-06-18 DIAGNOSIS — Z951 Presence of aortocoronary bypass graft: Secondary | ICD-10-CM | POA: Diagnosis not present

## 2021-06-18 DIAGNOSIS — I4891 Unspecified atrial fibrillation: Secondary | ICD-10-CM | POA: Diagnosis not present

## 2021-06-18 DIAGNOSIS — R001 Bradycardia, unspecified: Secondary | ICD-10-CM | POA: Diagnosis not present

## 2021-06-18 DIAGNOSIS — F32A Depression, unspecified: Secondary | ICD-10-CM | POA: Diagnosis not present

## 2021-06-18 DIAGNOSIS — Z87891 Personal history of nicotine dependence: Secondary | ICD-10-CM | POA: Diagnosis not present

## 2021-06-18 DIAGNOSIS — I5022 Chronic systolic (congestive) heart failure: Secondary | ICD-10-CM | POA: Diagnosis not present

## 2021-06-18 DIAGNOSIS — D62 Acute posthemorrhagic anemia: Secondary | ICD-10-CM | POA: Diagnosis not present

## 2021-06-18 DIAGNOSIS — K921 Melena: Secondary | ICD-10-CM | POA: Diagnosis not present

## 2021-06-18 DIAGNOSIS — Z7982 Long term (current) use of aspirin: Secondary | ICD-10-CM | POA: Diagnosis not present

## 2021-06-18 DIAGNOSIS — I110 Hypertensive heart disease with heart failure: Secondary | ICD-10-CM | POA: Diagnosis not present

## 2021-06-18 DIAGNOSIS — Z794 Long term (current) use of insulin: Secondary | ICD-10-CM | POA: Diagnosis not present

## 2021-06-18 DIAGNOSIS — W19XXXD Unspecified fall, subsequent encounter: Secondary | ICD-10-CM | POA: Diagnosis not present

## 2021-06-18 DIAGNOSIS — I739 Peripheral vascular disease, unspecified: Secondary | ICD-10-CM | POA: Diagnosis not present

## 2021-06-18 DIAGNOSIS — Z7984 Long term (current) use of oral hypoglycemic drugs: Secondary | ICD-10-CM | POA: Diagnosis not present

## 2021-06-18 DIAGNOSIS — I255 Ischemic cardiomyopathy: Secondary | ICD-10-CM | POA: Diagnosis not present

## 2021-06-18 DIAGNOSIS — Z7902 Long term (current) use of antithrombotics/antiplatelets: Secondary | ICD-10-CM | POA: Diagnosis not present

## 2021-06-18 DIAGNOSIS — S72332D Displaced oblique fracture of shaft of left femur, subsequent encounter for closed fracture with routine healing: Secondary | ICD-10-CM | POA: Diagnosis not present

## 2021-06-18 DIAGNOSIS — I2510 Atherosclerotic heart disease of native coronary artery without angina pectoris: Secondary | ICD-10-CM | POA: Diagnosis not present

## 2021-06-18 DIAGNOSIS — Z7901 Long term (current) use of anticoagulants: Secondary | ICD-10-CM | POA: Diagnosis not present

## 2021-06-18 DIAGNOSIS — Z955 Presence of coronary angioplasty implant and graft: Secondary | ICD-10-CM | POA: Diagnosis not present

## 2021-06-18 DIAGNOSIS — E782 Mixed hyperlipidemia: Secondary | ICD-10-CM | POA: Diagnosis not present

## 2021-06-18 DIAGNOSIS — E1151 Type 2 diabetes mellitus with diabetic peripheral angiopathy without gangrene: Secondary | ICD-10-CM | POA: Diagnosis not present

## 2021-06-19 DIAGNOSIS — I4891 Unspecified atrial fibrillation: Secondary | ICD-10-CM | POA: Diagnosis not present

## 2021-06-19 DIAGNOSIS — I2510 Atherosclerotic heart disease of native coronary artery without angina pectoris: Secondary | ICD-10-CM | POA: Diagnosis not present

## 2021-06-19 DIAGNOSIS — K921 Melena: Secondary | ICD-10-CM | POA: Diagnosis not present

## 2021-06-19 DIAGNOSIS — F32A Depression, unspecified: Secondary | ICD-10-CM | POA: Diagnosis not present

## 2021-06-19 DIAGNOSIS — Z794 Long term (current) use of insulin: Secondary | ICD-10-CM | POA: Diagnosis not present

## 2021-06-19 DIAGNOSIS — Z7984 Long term (current) use of oral hypoglycemic drugs: Secondary | ICD-10-CM | POA: Diagnosis not present

## 2021-06-19 DIAGNOSIS — D62 Acute posthemorrhagic anemia: Secondary | ICD-10-CM | POA: Diagnosis not present

## 2021-06-19 DIAGNOSIS — Z7901 Long term (current) use of anticoagulants: Secondary | ICD-10-CM | POA: Diagnosis not present

## 2021-06-19 DIAGNOSIS — R001 Bradycardia, unspecified: Secondary | ICD-10-CM | POA: Diagnosis not present

## 2021-06-19 DIAGNOSIS — E782 Mixed hyperlipidemia: Secondary | ICD-10-CM | POA: Diagnosis not present

## 2021-06-19 DIAGNOSIS — S72332D Displaced oblique fracture of shaft of left femur, subsequent encounter for closed fracture with routine healing: Secondary | ICD-10-CM | POA: Diagnosis not present

## 2021-06-19 DIAGNOSIS — I110 Hypertensive heart disease with heart failure: Secondary | ICD-10-CM | POA: Diagnosis not present

## 2021-06-19 DIAGNOSIS — Z7982 Long term (current) use of aspirin: Secondary | ICD-10-CM | POA: Diagnosis not present

## 2021-06-19 DIAGNOSIS — Z955 Presence of coronary angioplasty implant and graft: Secondary | ICD-10-CM | POA: Diagnosis not present

## 2021-06-19 DIAGNOSIS — E1151 Type 2 diabetes mellitus with diabetic peripheral angiopathy without gangrene: Secondary | ICD-10-CM | POA: Diagnosis not present

## 2021-06-19 DIAGNOSIS — I255 Ischemic cardiomyopathy: Secondary | ICD-10-CM | POA: Diagnosis not present

## 2021-06-19 DIAGNOSIS — I5022 Chronic systolic (congestive) heart failure: Secondary | ICD-10-CM | POA: Diagnosis not present

## 2021-06-19 DIAGNOSIS — J449 Chronic obstructive pulmonary disease, unspecified: Secondary | ICD-10-CM | POA: Diagnosis not present

## 2021-06-19 DIAGNOSIS — Z7902 Long term (current) use of antithrombotics/antiplatelets: Secondary | ICD-10-CM | POA: Diagnosis not present

## 2021-06-19 DIAGNOSIS — I739 Peripheral vascular disease, unspecified: Secondary | ICD-10-CM | POA: Diagnosis not present

## 2021-06-19 DIAGNOSIS — W19XXXD Unspecified fall, subsequent encounter: Secondary | ICD-10-CM | POA: Diagnosis not present

## 2021-06-19 DIAGNOSIS — Z951 Presence of aortocoronary bypass graft: Secondary | ICD-10-CM | POA: Diagnosis not present

## 2021-06-19 DIAGNOSIS — Z87891 Personal history of nicotine dependence: Secondary | ICD-10-CM | POA: Diagnosis not present

## 2021-06-20 DIAGNOSIS — I5022 Chronic systolic (congestive) heart failure: Secondary | ICD-10-CM | POA: Diagnosis not present

## 2021-06-20 DIAGNOSIS — R001 Bradycardia, unspecified: Secondary | ICD-10-CM | POA: Diagnosis not present

## 2021-06-20 DIAGNOSIS — D62 Acute posthemorrhagic anemia: Secondary | ICD-10-CM | POA: Diagnosis not present

## 2021-06-20 DIAGNOSIS — Z7982 Long term (current) use of aspirin: Secondary | ICD-10-CM | POA: Diagnosis not present

## 2021-06-20 DIAGNOSIS — Z951 Presence of aortocoronary bypass graft: Secondary | ICD-10-CM | POA: Diagnosis not present

## 2021-06-20 DIAGNOSIS — K921 Melena: Secondary | ICD-10-CM | POA: Diagnosis not present

## 2021-06-20 DIAGNOSIS — I255 Ischemic cardiomyopathy: Secondary | ICD-10-CM | POA: Diagnosis not present

## 2021-06-20 DIAGNOSIS — W19XXXD Unspecified fall, subsequent encounter: Secondary | ICD-10-CM | POA: Diagnosis not present

## 2021-06-20 DIAGNOSIS — I739 Peripheral vascular disease, unspecified: Secondary | ICD-10-CM | POA: Diagnosis not present

## 2021-06-20 DIAGNOSIS — Z87891 Personal history of nicotine dependence: Secondary | ICD-10-CM | POA: Diagnosis not present

## 2021-06-20 DIAGNOSIS — Z955 Presence of coronary angioplasty implant and graft: Secondary | ICD-10-CM | POA: Diagnosis not present

## 2021-06-20 DIAGNOSIS — Z794 Long term (current) use of insulin: Secondary | ICD-10-CM | POA: Diagnosis not present

## 2021-06-20 DIAGNOSIS — Z7902 Long term (current) use of antithrombotics/antiplatelets: Secondary | ICD-10-CM | POA: Diagnosis not present

## 2021-06-20 DIAGNOSIS — Z7901 Long term (current) use of anticoagulants: Secondary | ICD-10-CM | POA: Diagnosis not present

## 2021-06-20 DIAGNOSIS — Z7984 Long term (current) use of oral hypoglycemic drugs: Secondary | ICD-10-CM | POA: Diagnosis not present

## 2021-06-20 DIAGNOSIS — E1151 Type 2 diabetes mellitus with diabetic peripheral angiopathy without gangrene: Secondary | ICD-10-CM | POA: Diagnosis not present

## 2021-06-20 DIAGNOSIS — I2510 Atherosclerotic heart disease of native coronary artery without angina pectoris: Secondary | ICD-10-CM | POA: Diagnosis not present

## 2021-06-20 DIAGNOSIS — E782 Mixed hyperlipidemia: Secondary | ICD-10-CM | POA: Diagnosis not present

## 2021-06-20 DIAGNOSIS — J449 Chronic obstructive pulmonary disease, unspecified: Secondary | ICD-10-CM | POA: Diagnosis not present

## 2021-06-20 DIAGNOSIS — I4891 Unspecified atrial fibrillation: Secondary | ICD-10-CM | POA: Diagnosis not present

## 2021-06-20 DIAGNOSIS — F32A Depression, unspecified: Secondary | ICD-10-CM | POA: Diagnosis not present

## 2021-06-20 DIAGNOSIS — S72332D Displaced oblique fracture of shaft of left femur, subsequent encounter for closed fracture with routine healing: Secondary | ICD-10-CM | POA: Diagnosis not present

## 2021-06-20 DIAGNOSIS — I110 Hypertensive heart disease with heart failure: Secondary | ICD-10-CM | POA: Diagnosis not present

## 2021-06-26 DIAGNOSIS — Z794 Long term (current) use of insulin: Secondary | ICD-10-CM | POA: Diagnosis not present

## 2021-06-26 DIAGNOSIS — K921 Melena: Secondary | ICD-10-CM | POA: Diagnosis not present

## 2021-06-26 DIAGNOSIS — Z951 Presence of aortocoronary bypass graft: Secondary | ICD-10-CM | POA: Diagnosis not present

## 2021-06-26 DIAGNOSIS — E1151 Type 2 diabetes mellitus with diabetic peripheral angiopathy without gangrene: Secondary | ICD-10-CM | POA: Diagnosis not present

## 2021-06-26 DIAGNOSIS — R001 Bradycardia, unspecified: Secondary | ICD-10-CM | POA: Diagnosis not present

## 2021-06-26 DIAGNOSIS — S72332D Displaced oblique fracture of shaft of left femur, subsequent encounter for closed fracture with routine healing: Secondary | ICD-10-CM | POA: Diagnosis not present

## 2021-06-26 DIAGNOSIS — F32A Depression, unspecified: Secondary | ICD-10-CM | POA: Diagnosis not present

## 2021-06-26 DIAGNOSIS — W19XXXD Unspecified fall, subsequent encounter: Secondary | ICD-10-CM | POA: Diagnosis not present

## 2021-06-26 DIAGNOSIS — I2510 Atherosclerotic heart disease of native coronary artery without angina pectoris: Secondary | ICD-10-CM | POA: Diagnosis not present

## 2021-06-26 DIAGNOSIS — I5022 Chronic systolic (congestive) heart failure: Secondary | ICD-10-CM | POA: Diagnosis not present

## 2021-06-26 DIAGNOSIS — Z87891 Personal history of nicotine dependence: Secondary | ICD-10-CM | POA: Diagnosis not present

## 2021-06-26 DIAGNOSIS — D62 Acute posthemorrhagic anemia: Secondary | ICD-10-CM | POA: Diagnosis not present

## 2021-06-26 DIAGNOSIS — Z955 Presence of coronary angioplasty implant and graft: Secondary | ICD-10-CM | POA: Diagnosis not present

## 2021-06-26 DIAGNOSIS — I255 Ischemic cardiomyopathy: Secondary | ICD-10-CM | POA: Diagnosis not present

## 2021-06-26 DIAGNOSIS — E782 Mixed hyperlipidemia: Secondary | ICD-10-CM | POA: Diagnosis not present

## 2021-06-26 DIAGNOSIS — I739 Peripheral vascular disease, unspecified: Secondary | ICD-10-CM | POA: Diagnosis not present

## 2021-06-26 DIAGNOSIS — Z7982 Long term (current) use of aspirin: Secondary | ICD-10-CM | POA: Diagnosis not present

## 2021-06-26 DIAGNOSIS — J449 Chronic obstructive pulmonary disease, unspecified: Secondary | ICD-10-CM | POA: Diagnosis not present

## 2021-06-26 DIAGNOSIS — Z7902 Long term (current) use of antithrombotics/antiplatelets: Secondary | ICD-10-CM | POA: Diagnosis not present

## 2021-06-26 DIAGNOSIS — I4891 Unspecified atrial fibrillation: Secondary | ICD-10-CM | POA: Diagnosis not present

## 2021-06-26 DIAGNOSIS — I110 Hypertensive heart disease with heart failure: Secondary | ICD-10-CM | POA: Diagnosis not present

## 2021-06-26 DIAGNOSIS — Z7901 Long term (current) use of anticoagulants: Secondary | ICD-10-CM | POA: Diagnosis not present

## 2021-06-26 DIAGNOSIS — Z7984 Long term (current) use of oral hypoglycemic drugs: Secondary | ICD-10-CM | POA: Diagnosis not present

## 2021-06-27 DIAGNOSIS — Z955 Presence of coronary angioplasty implant and graft: Secondary | ICD-10-CM | POA: Diagnosis not present

## 2021-06-27 DIAGNOSIS — I255 Ischemic cardiomyopathy: Secondary | ICD-10-CM | POA: Diagnosis not present

## 2021-06-27 DIAGNOSIS — Z7901 Long term (current) use of anticoagulants: Secondary | ICD-10-CM | POA: Diagnosis not present

## 2021-06-27 DIAGNOSIS — R001 Bradycardia, unspecified: Secondary | ICD-10-CM | POA: Diagnosis not present

## 2021-06-27 DIAGNOSIS — E1151 Type 2 diabetes mellitus with diabetic peripheral angiopathy without gangrene: Secondary | ICD-10-CM | POA: Diagnosis not present

## 2021-06-27 DIAGNOSIS — I4891 Unspecified atrial fibrillation: Secondary | ICD-10-CM | POA: Diagnosis not present

## 2021-06-27 DIAGNOSIS — W19XXXD Unspecified fall, subsequent encounter: Secondary | ICD-10-CM | POA: Diagnosis not present

## 2021-06-27 DIAGNOSIS — I110 Hypertensive heart disease with heart failure: Secondary | ICD-10-CM | POA: Diagnosis not present

## 2021-06-27 DIAGNOSIS — F32A Depression, unspecified: Secondary | ICD-10-CM | POA: Diagnosis not present

## 2021-06-27 DIAGNOSIS — Z7984 Long term (current) use of oral hypoglycemic drugs: Secondary | ICD-10-CM | POA: Diagnosis not present

## 2021-06-27 DIAGNOSIS — Z951 Presence of aortocoronary bypass graft: Secondary | ICD-10-CM | POA: Diagnosis not present

## 2021-06-27 DIAGNOSIS — I739 Peripheral vascular disease, unspecified: Secondary | ICD-10-CM | POA: Diagnosis not present

## 2021-06-27 DIAGNOSIS — S72332D Displaced oblique fracture of shaft of left femur, subsequent encounter for closed fracture with routine healing: Secondary | ICD-10-CM | POA: Diagnosis not present

## 2021-06-27 DIAGNOSIS — Z7982 Long term (current) use of aspirin: Secondary | ICD-10-CM | POA: Diagnosis not present

## 2021-06-27 DIAGNOSIS — Z7902 Long term (current) use of antithrombotics/antiplatelets: Secondary | ICD-10-CM | POA: Diagnosis not present

## 2021-06-27 DIAGNOSIS — I2510 Atherosclerotic heart disease of native coronary artery without angina pectoris: Secondary | ICD-10-CM | POA: Diagnosis not present

## 2021-06-27 DIAGNOSIS — Z87891 Personal history of nicotine dependence: Secondary | ICD-10-CM | POA: Diagnosis not present

## 2021-06-27 DIAGNOSIS — J449 Chronic obstructive pulmonary disease, unspecified: Secondary | ICD-10-CM | POA: Diagnosis not present

## 2021-06-27 DIAGNOSIS — Z794 Long term (current) use of insulin: Secondary | ICD-10-CM | POA: Diagnosis not present

## 2021-06-27 DIAGNOSIS — I5022 Chronic systolic (congestive) heart failure: Secondary | ICD-10-CM | POA: Diagnosis not present

## 2021-06-27 DIAGNOSIS — E782 Mixed hyperlipidemia: Secondary | ICD-10-CM | POA: Diagnosis not present

## 2021-06-27 DIAGNOSIS — D62 Acute posthemorrhagic anemia: Secondary | ICD-10-CM | POA: Diagnosis not present

## 2021-06-27 DIAGNOSIS — K921 Melena: Secondary | ICD-10-CM | POA: Diagnosis not present

## 2021-06-28 DIAGNOSIS — Z794 Long term (current) use of insulin: Secondary | ICD-10-CM | POA: Diagnosis not present

## 2021-06-28 DIAGNOSIS — I255 Ischemic cardiomyopathy: Secondary | ICD-10-CM | POA: Diagnosis not present

## 2021-06-28 DIAGNOSIS — I2510 Atherosclerotic heart disease of native coronary artery without angina pectoris: Secondary | ICD-10-CM | POA: Diagnosis not present

## 2021-06-28 DIAGNOSIS — Z7984 Long term (current) use of oral hypoglycemic drugs: Secondary | ICD-10-CM | POA: Diagnosis not present

## 2021-06-28 DIAGNOSIS — Z7982 Long term (current) use of aspirin: Secondary | ICD-10-CM | POA: Diagnosis not present

## 2021-06-28 DIAGNOSIS — Z7902 Long term (current) use of antithrombotics/antiplatelets: Secondary | ICD-10-CM | POA: Diagnosis not present

## 2021-06-28 DIAGNOSIS — I4891 Unspecified atrial fibrillation: Secondary | ICD-10-CM | POA: Diagnosis not present

## 2021-06-28 DIAGNOSIS — F32A Depression, unspecified: Secondary | ICD-10-CM | POA: Diagnosis not present

## 2021-06-28 DIAGNOSIS — D62 Acute posthemorrhagic anemia: Secondary | ICD-10-CM | POA: Diagnosis not present

## 2021-06-28 DIAGNOSIS — Z955 Presence of coronary angioplasty implant and graft: Secondary | ICD-10-CM | POA: Diagnosis not present

## 2021-06-28 DIAGNOSIS — E782 Mixed hyperlipidemia: Secondary | ICD-10-CM | POA: Diagnosis not present

## 2021-06-28 DIAGNOSIS — I110 Hypertensive heart disease with heart failure: Secondary | ICD-10-CM | POA: Diagnosis not present

## 2021-06-28 DIAGNOSIS — Z7901 Long term (current) use of anticoagulants: Secondary | ICD-10-CM | POA: Diagnosis not present

## 2021-06-28 DIAGNOSIS — I5022 Chronic systolic (congestive) heart failure: Secondary | ICD-10-CM | POA: Diagnosis not present

## 2021-06-28 DIAGNOSIS — W19XXXD Unspecified fall, subsequent encounter: Secondary | ICD-10-CM | POA: Diagnosis not present

## 2021-06-28 DIAGNOSIS — J449 Chronic obstructive pulmonary disease, unspecified: Secondary | ICD-10-CM | POA: Diagnosis not present

## 2021-06-28 DIAGNOSIS — S72332D Displaced oblique fracture of shaft of left femur, subsequent encounter for closed fracture with routine healing: Secondary | ICD-10-CM | POA: Diagnosis not present

## 2021-06-28 DIAGNOSIS — Z951 Presence of aortocoronary bypass graft: Secondary | ICD-10-CM | POA: Diagnosis not present

## 2021-06-28 DIAGNOSIS — Z87891 Personal history of nicotine dependence: Secondary | ICD-10-CM | POA: Diagnosis not present

## 2021-06-28 DIAGNOSIS — R001 Bradycardia, unspecified: Secondary | ICD-10-CM | POA: Diagnosis not present

## 2021-06-28 DIAGNOSIS — I739 Peripheral vascular disease, unspecified: Secondary | ICD-10-CM | POA: Diagnosis not present

## 2021-06-28 DIAGNOSIS — E1151 Type 2 diabetes mellitus with diabetic peripheral angiopathy without gangrene: Secondary | ICD-10-CM | POA: Diagnosis not present

## 2021-06-28 DIAGNOSIS — K921 Melena: Secondary | ICD-10-CM | POA: Diagnosis not present

## 2021-07-03 DIAGNOSIS — E1165 Type 2 diabetes mellitus with hyperglycemia: Secondary | ICD-10-CM | POA: Diagnosis not present

## 2021-07-03 DIAGNOSIS — E782 Mixed hyperlipidemia: Secondary | ICD-10-CM | POA: Diagnosis not present

## 2021-07-04 DIAGNOSIS — Z7982 Long term (current) use of aspirin: Secondary | ICD-10-CM | POA: Diagnosis not present

## 2021-07-04 DIAGNOSIS — Z955 Presence of coronary angioplasty implant and graft: Secondary | ICD-10-CM | POA: Diagnosis not present

## 2021-07-04 DIAGNOSIS — F32A Depression, unspecified: Secondary | ICD-10-CM | POA: Diagnosis not present

## 2021-07-04 DIAGNOSIS — I739 Peripheral vascular disease, unspecified: Secondary | ICD-10-CM | POA: Diagnosis not present

## 2021-07-04 DIAGNOSIS — Z87891 Personal history of nicotine dependence: Secondary | ICD-10-CM | POA: Diagnosis not present

## 2021-07-04 DIAGNOSIS — Z951 Presence of aortocoronary bypass graft: Secondary | ICD-10-CM | POA: Diagnosis not present

## 2021-07-04 DIAGNOSIS — Z794 Long term (current) use of insulin: Secondary | ICD-10-CM | POA: Diagnosis not present

## 2021-07-04 DIAGNOSIS — I5022 Chronic systolic (congestive) heart failure: Secondary | ICD-10-CM | POA: Diagnosis not present

## 2021-07-04 DIAGNOSIS — E782 Mixed hyperlipidemia: Secondary | ICD-10-CM | POA: Diagnosis not present

## 2021-07-04 DIAGNOSIS — D62 Acute posthemorrhagic anemia: Secondary | ICD-10-CM | POA: Diagnosis not present

## 2021-07-04 DIAGNOSIS — Z7901 Long term (current) use of anticoagulants: Secondary | ICD-10-CM | POA: Diagnosis not present

## 2021-07-04 DIAGNOSIS — I110 Hypertensive heart disease with heart failure: Secondary | ICD-10-CM | POA: Diagnosis not present

## 2021-07-04 DIAGNOSIS — W19XXXD Unspecified fall, subsequent encounter: Secondary | ICD-10-CM | POA: Diagnosis not present

## 2021-07-04 DIAGNOSIS — E1151 Type 2 diabetes mellitus with diabetic peripheral angiopathy without gangrene: Secondary | ICD-10-CM | POA: Diagnosis not present

## 2021-07-04 DIAGNOSIS — I255 Ischemic cardiomyopathy: Secondary | ICD-10-CM | POA: Diagnosis not present

## 2021-07-04 DIAGNOSIS — I4891 Unspecified atrial fibrillation: Secondary | ICD-10-CM | POA: Diagnosis not present

## 2021-07-04 DIAGNOSIS — Z7984 Long term (current) use of oral hypoglycemic drugs: Secondary | ICD-10-CM | POA: Diagnosis not present

## 2021-07-04 DIAGNOSIS — J449 Chronic obstructive pulmonary disease, unspecified: Secondary | ICD-10-CM | POA: Diagnosis not present

## 2021-07-04 DIAGNOSIS — K921 Melena: Secondary | ICD-10-CM | POA: Diagnosis not present

## 2021-07-04 DIAGNOSIS — I2510 Atherosclerotic heart disease of native coronary artery without angina pectoris: Secondary | ICD-10-CM | POA: Diagnosis not present

## 2021-07-04 DIAGNOSIS — R001 Bradycardia, unspecified: Secondary | ICD-10-CM | POA: Diagnosis not present

## 2021-07-04 DIAGNOSIS — S72332D Displaced oblique fracture of shaft of left femur, subsequent encounter for closed fracture with routine healing: Secondary | ICD-10-CM | POA: Diagnosis not present

## 2021-07-04 DIAGNOSIS — Z7902 Long term (current) use of antithrombotics/antiplatelets: Secondary | ICD-10-CM | POA: Diagnosis not present

## 2021-07-05 DIAGNOSIS — Z7902 Long term (current) use of antithrombotics/antiplatelets: Secondary | ICD-10-CM | POA: Diagnosis not present

## 2021-07-05 DIAGNOSIS — Z955 Presence of coronary angioplasty implant and graft: Secondary | ICD-10-CM | POA: Diagnosis not present

## 2021-07-05 DIAGNOSIS — K921 Melena: Secondary | ICD-10-CM | POA: Diagnosis not present

## 2021-07-05 DIAGNOSIS — I2510 Atherosclerotic heart disease of native coronary artery without angina pectoris: Secondary | ICD-10-CM | POA: Diagnosis not present

## 2021-07-05 DIAGNOSIS — Z7901 Long term (current) use of anticoagulants: Secondary | ICD-10-CM | POA: Diagnosis not present

## 2021-07-05 DIAGNOSIS — I5022 Chronic systolic (congestive) heart failure: Secondary | ICD-10-CM | POA: Diagnosis not present

## 2021-07-05 DIAGNOSIS — E782 Mixed hyperlipidemia: Secondary | ICD-10-CM | POA: Diagnosis not present

## 2021-07-05 DIAGNOSIS — E1151 Type 2 diabetes mellitus with diabetic peripheral angiopathy without gangrene: Secondary | ICD-10-CM | POA: Diagnosis not present

## 2021-07-05 DIAGNOSIS — I739 Peripheral vascular disease, unspecified: Secondary | ICD-10-CM | POA: Diagnosis not present

## 2021-07-05 DIAGNOSIS — Z7984 Long term (current) use of oral hypoglycemic drugs: Secondary | ICD-10-CM | POA: Diagnosis not present

## 2021-07-05 DIAGNOSIS — Z87891 Personal history of nicotine dependence: Secondary | ICD-10-CM | POA: Diagnosis not present

## 2021-07-05 DIAGNOSIS — D62 Acute posthemorrhagic anemia: Secondary | ICD-10-CM | POA: Diagnosis not present

## 2021-07-05 DIAGNOSIS — F32A Depression, unspecified: Secondary | ICD-10-CM | POA: Diagnosis not present

## 2021-07-05 DIAGNOSIS — Z794 Long term (current) use of insulin: Secondary | ICD-10-CM | POA: Diagnosis not present

## 2021-07-05 DIAGNOSIS — Z951 Presence of aortocoronary bypass graft: Secondary | ICD-10-CM | POA: Diagnosis not present

## 2021-07-05 DIAGNOSIS — S72332D Displaced oblique fracture of shaft of left femur, subsequent encounter for closed fracture with routine healing: Secondary | ICD-10-CM | POA: Diagnosis not present

## 2021-07-05 DIAGNOSIS — J449 Chronic obstructive pulmonary disease, unspecified: Secondary | ICD-10-CM | POA: Diagnosis not present

## 2021-07-05 DIAGNOSIS — W19XXXD Unspecified fall, subsequent encounter: Secondary | ICD-10-CM | POA: Diagnosis not present

## 2021-07-05 DIAGNOSIS — I110 Hypertensive heart disease with heart failure: Secondary | ICD-10-CM | POA: Diagnosis not present

## 2021-07-05 DIAGNOSIS — I4891 Unspecified atrial fibrillation: Secondary | ICD-10-CM | POA: Diagnosis not present

## 2021-07-05 DIAGNOSIS — R001 Bradycardia, unspecified: Secondary | ICD-10-CM | POA: Diagnosis not present

## 2021-07-05 DIAGNOSIS — I255 Ischemic cardiomyopathy: Secondary | ICD-10-CM | POA: Diagnosis not present

## 2021-07-05 DIAGNOSIS — Z7982 Long term (current) use of aspirin: Secondary | ICD-10-CM | POA: Diagnosis not present

## 2021-07-06 DIAGNOSIS — I110 Hypertensive heart disease with heart failure: Secondary | ICD-10-CM | POA: Diagnosis not present

## 2021-07-06 DIAGNOSIS — I2510 Atherosclerotic heart disease of native coronary artery without angina pectoris: Secondary | ICD-10-CM | POA: Diagnosis not present

## 2021-07-06 DIAGNOSIS — J441 Chronic obstructive pulmonary disease with (acute) exacerbation: Secondary | ICD-10-CM | POA: Diagnosis not present

## 2021-07-06 DIAGNOSIS — Z951 Presence of aortocoronary bypass graft: Secondary | ICD-10-CM | POA: Diagnosis not present

## 2021-07-06 DIAGNOSIS — D62 Acute posthemorrhagic anemia: Secondary | ICD-10-CM | POA: Diagnosis not present

## 2021-07-06 DIAGNOSIS — N182 Chronic kidney disease, stage 2 (mild): Secondary | ICD-10-CM | POA: Diagnosis not present

## 2021-07-06 DIAGNOSIS — S72332D Displaced oblique fracture of shaft of left femur, subsequent encounter for closed fracture with routine healing: Secondary | ICD-10-CM | POA: Diagnosis not present

## 2021-07-06 DIAGNOSIS — W19XXXD Unspecified fall, subsequent encounter: Secondary | ICD-10-CM | POA: Diagnosis not present

## 2021-07-06 DIAGNOSIS — J449 Chronic obstructive pulmonary disease, unspecified: Secondary | ICD-10-CM | POA: Diagnosis not present

## 2021-07-06 DIAGNOSIS — Z7984 Long term (current) use of oral hypoglycemic drugs: Secondary | ICD-10-CM | POA: Diagnosis not present

## 2021-07-06 DIAGNOSIS — I10 Essential (primary) hypertension: Secondary | ICD-10-CM | POA: Diagnosis not present

## 2021-07-06 DIAGNOSIS — D509 Iron deficiency anemia, unspecified: Secondary | ICD-10-CM | POA: Diagnosis not present

## 2021-07-06 DIAGNOSIS — I714 Abdominal aortic aneurysm, without rupture, unspecified: Secondary | ICD-10-CM | POA: Diagnosis not present

## 2021-07-06 DIAGNOSIS — I739 Peripheral vascular disease, unspecified: Secondary | ICD-10-CM | POA: Diagnosis not present

## 2021-07-06 DIAGNOSIS — F17201 Nicotine dependence, unspecified, in remission: Secondary | ICD-10-CM | POA: Diagnosis not present

## 2021-07-06 DIAGNOSIS — R7401 Elevation of levels of liver transaminase levels: Secondary | ICD-10-CM | POA: Diagnosis not present

## 2021-07-06 DIAGNOSIS — Z7982 Long term (current) use of aspirin: Secondary | ICD-10-CM | POA: Diagnosis not present

## 2021-07-06 DIAGNOSIS — E1165 Type 2 diabetes mellitus with hyperglycemia: Secondary | ICD-10-CM | POA: Diagnosis not present

## 2021-07-06 DIAGNOSIS — M109 Gout, unspecified: Secondary | ICD-10-CM | POA: Diagnosis not present

## 2021-07-06 DIAGNOSIS — F32A Depression, unspecified: Secondary | ICD-10-CM | POA: Diagnosis not present

## 2021-07-06 DIAGNOSIS — R809 Proteinuria, unspecified: Secondary | ICD-10-CM | POA: Diagnosis not present

## 2021-07-06 DIAGNOSIS — K921 Melena: Secondary | ICD-10-CM | POA: Diagnosis not present

## 2021-07-06 DIAGNOSIS — I255 Ischemic cardiomyopathy: Secondary | ICD-10-CM | POA: Diagnosis not present

## 2021-07-06 DIAGNOSIS — Z87891 Personal history of nicotine dependence: Secondary | ICD-10-CM | POA: Diagnosis not present

## 2021-07-06 DIAGNOSIS — I4891 Unspecified atrial fibrillation: Secondary | ICD-10-CM | POA: Diagnosis not present

## 2021-07-06 DIAGNOSIS — Z7902 Long term (current) use of antithrombotics/antiplatelets: Secondary | ICD-10-CM | POA: Diagnosis not present

## 2021-07-06 DIAGNOSIS — I5022 Chronic systolic (congestive) heart failure: Secondary | ICD-10-CM | POA: Diagnosis not present

## 2021-07-06 DIAGNOSIS — Z955 Presence of coronary angioplasty implant and graft: Secondary | ICD-10-CM | POA: Diagnosis not present

## 2021-07-06 DIAGNOSIS — E782 Mixed hyperlipidemia: Secondary | ICD-10-CM | POA: Diagnosis not present

## 2021-07-06 DIAGNOSIS — E1151 Type 2 diabetes mellitus with diabetic peripheral angiopathy without gangrene: Secondary | ICD-10-CM | POA: Diagnosis not present

## 2021-07-06 DIAGNOSIS — Z7901 Long term (current) use of anticoagulants: Secondary | ICD-10-CM | POA: Diagnosis not present

## 2021-07-06 DIAGNOSIS — Z794 Long term (current) use of insulin: Secondary | ICD-10-CM | POA: Diagnosis not present

## 2021-07-06 DIAGNOSIS — R001 Bradycardia, unspecified: Secondary | ICD-10-CM | POA: Diagnosis not present

## 2021-07-09 DIAGNOSIS — Z794 Long term (current) use of insulin: Secondary | ICD-10-CM | POA: Diagnosis not present

## 2021-07-09 DIAGNOSIS — I255 Ischemic cardiomyopathy: Secondary | ICD-10-CM | POA: Diagnosis not present

## 2021-07-09 DIAGNOSIS — I2510 Atherosclerotic heart disease of native coronary artery without angina pectoris: Secondary | ICD-10-CM | POA: Diagnosis not present

## 2021-07-09 DIAGNOSIS — W19XXXD Unspecified fall, subsequent encounter: Secondary | ICD-10-CM | POA: Diagnosis not present

## 2021-07-09 DIAGNOSIS — D62 Acute posthemorrhagic anemia: Secondary | ICD-10-CM | POA: Diagnosis not present

## 2021-07-09 DIAGNOSIS — I4891 Unspecified atrial fibrillation: Secondary | ICD-10-CM | POA: Diagnosis not present

## 2021-07-09 DIAGNOSIS — Z87891 Personal history of nicotine dependence: Secondary | ICD-10-CM | POA: Diagnosis not present

## 2021-07-09 DIAGNOSIS — R001 Bradycardia, unspecified: Secondary | ICD-10-CM | POA: Diagnosis not present

## 2021-07-09 DIAGNOSIS — I110 Hypertensive heart disease with heart failure: Secondary | ICD-10-CM | POA: Diagnosis not present

## 2021-07-09 DIAGNOSIS — Z7982 Long term (current) use of aspirin: Secondary | ICD-10-CM | POA: Diagnosis not present

## 2021-07-09 DIAGNOSIS — Z7902 Long term (current) use of antithrombotics/antiplatelets: Secondary | ICD-10-CM | POA: Diagnosis not present

## 2021-07-09 DIAGNOSIS — Z7901 Long term (current) use of anticoagulants: Secondary | ICD-10-CM | POA: Diagnosis not present

## 2021-07-09 DIAGNOSIS — Z955 Presence of coronary angioplasty implant and graft: Secondary | ICD-10-CM | POA: Diagnosis not present

## 2021-07-09 DIAGNOSIS — Z951 Presence of aortocoronary bypass graft: Secondary | ICD-10-CM | POA: Diagnosis not present

## 2021-07-09 DIAGNOSIS — S72332D Displaced oblique fracture of shaft of left femur, subsequent encounter for closed fracture with routine healing: Secondary | ICD-10-CM | POA: Diagnosis not present

## 2021-07-09 DIAGNOSIS — I739 Peripheral vascular disease, unspecified: Secondary | ICD-10-CM | POA: Diagnosis not present

## 2021-07-09 DIAGNOSIS — Z7984 Long term (current) use of oral hypoglycemic drugs: Secondary | ICD-10-CM | POA: Diagnosis not present

## 2021-07-09 DIAGNOSIS — E782 Mixed hyperlipidemia: Secondary | ICD-10-CM | POA: Diagnosis not present

## 2021-07-09 DIAGNOSIS — F32A Depression, unspecified: Secondary | ICD-10-CM | POA: Diagnosis not present

## 2021-07-09 DIAGNOSIS — K921 Melena: Secondary | ICD-10-CM | POA: Diagnosis not present

## 2021-07-09 DIAGNOSIS — E1151 Type 2 diabetes mellitus with diabetic peripheral angiopathy without gangrene: Secondary | ICD-10-CM | POA: Diagnosis not present

## 2021-07-09 DIAGNOSIS — I5022 Chronic systolic (congestive) heart failure: Secondary | ICD-10-CM | POA: Diagnosis not present

## 2021-07-09 DIAGNOSIS — J449 Chronic obstructive pulmonary disease, unspecified: Secondary | ICD-10-CM | POA: Diagnosis not present

## 2021-09-26 DIAGNOSIS — S7290XD Unspecified fracture of unspecified femur, subsequent encounter for closed fracture with routine healing: Secondary | ICD-10-CM | POA: Diagnosis not present

## 2021-09-26 DIAGNOSIS — M6281 Muscle weakness (generalized): Secondary | ICD-10-CM | POA: Diagnosis not present

## 2021-09-26 DIAGNOSIS — R2689 Other abnormalities of gait and mobility: Secondary | ICD-10-CM | POA: Diagnosis not present

## 2021-10-01 DIAGNOSIS — R0789 Other chest pain: Secondary | ICD-10-CM | POA: Diagnosis not present

## 2021-10-01 DIAGNOSIS — R0602 Shortness of breath: Secondary | ICD-10-CM | POA: Diagnosis not present

## 2021-10-03 DIAGNOSIS — M6281 Muscle weakness (generalized): Secondary | ICD-10-CM | POA: Diagnosis not present

## 2021-10-03 DIAGNOSIS — S7290XD Unspecified fracture of unspecified femur, subsequent encounter for closed fracture with routine healing: Secondary | ICD-10-CM | POA: Diagnosis not present

## 2021-10-03 DIAGNOSIS — R2689 Other abnormalities of gait and mobility: Secondary | ICD-10-CM | POA: Diagnosis not present

## 2021-10-09 DIAGNOSIS — S7290XD Unspecified fracture of unspecified femur, subsequent encounter for closed fracture with routine healing: Secondary | ICD-10-CM | POA: Diagnosis not present

## 2021-10-09 DIAGNOSIS — M6281 Muscle weakness (generalized): Secondary | ICD-10-CM | POA: Diagnosis not present

## 2021-10-09 DIAGNOSIS — R2689 Other abnormalities of gait and mobility: Secondary | ICD-10-CM | POA: Diagnosis not present

## 2021-10-16 DIAGNOSIS — S7290XD Unspecified fracture of unspecified femur, subsequent encounter for closed fracture with routine healing: Secondary | ICD-10-CM | POA: Diagnosis not present

## 2021-10-16 DIAGNOSIS — M6281 Muscle weakness (generalized): Secondary | ICD-10-CM | POA: Diagnosis not present

## 2021-10-16 DIAGNOSIS — R2689 Other abnormalities of gait and mobility: Secondary | ICD-10-CM | POA: Diagnosis not present

## 2021-10-21 IMAGING — CT CT ANGIO ABDOMEN
3 of 12 series · 11 of 46 positions shown, 17 images · IV contrast (omnipaque)
Comparison: None.

CLINICAL DATA: Melena, history of abdominal aortic aneurysm status
post repair, rectal bleeding since lower extremity surgery 2 weeks
ago

EXAM:
CTA ABDOMEN AND PELVIS WITHOUT AND WITH CONTRAST
TECHNIQUE: Multidetector CT imaging of the abdomen and pelvis was performed
using the standard protocol during bolus administration of
intravenous contrast. Multiplanar reconstructed images and MIPs were
obtained and reviewed to evaluate the vascular anatomy.
CONTRAST:  75mL OMNIPAQUE IOHEXOL 350 MG/ML SOLN

[Series 9: mesenteric axial arterial · axial · arterial · 0.69mm/px · z∈[+663,+919]mm · 6 of 224 slices shown]
[im 16/224  soft-tissue]
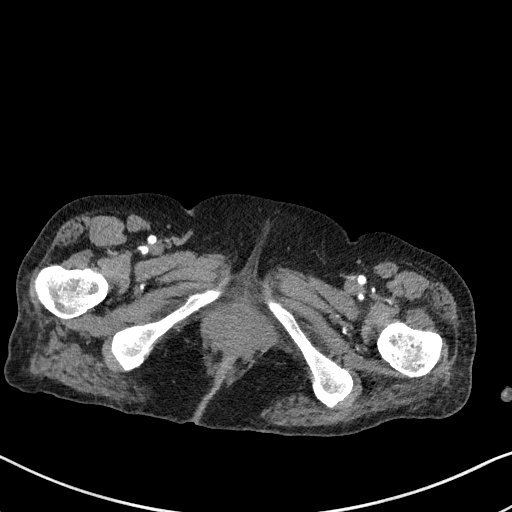
[im 48/224  soft-tissue]
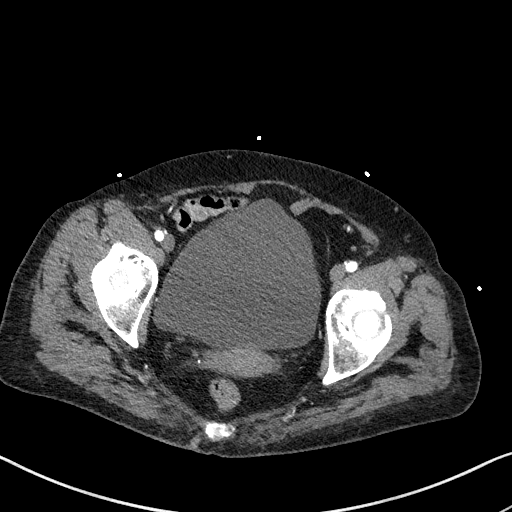
[im 80/224  soft-tissue]
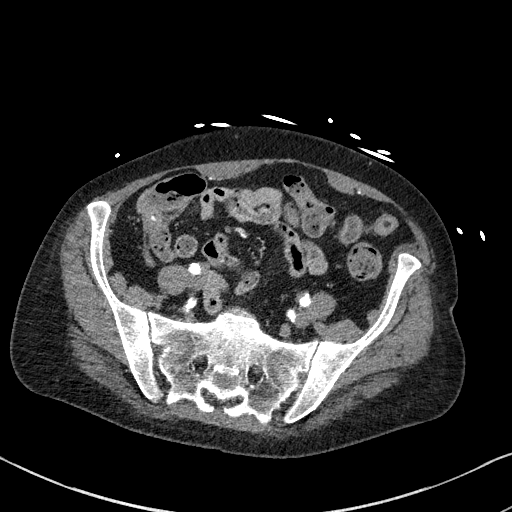
[im 96/224  soft-tissue]
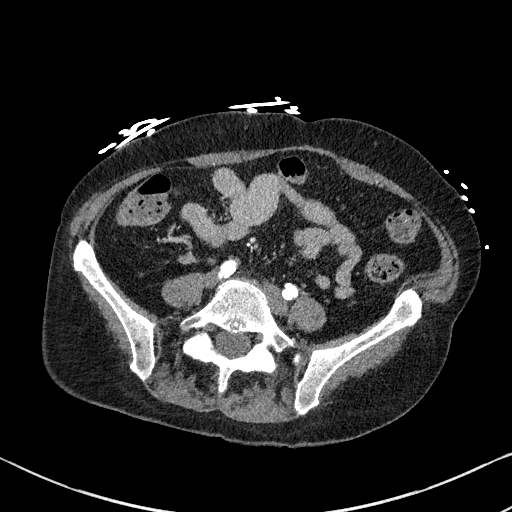
[im 128/224  soft-tissue]
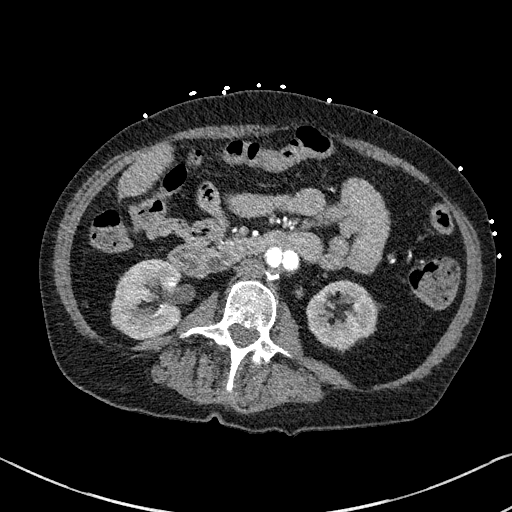
[im 144/224  soft-tissue]
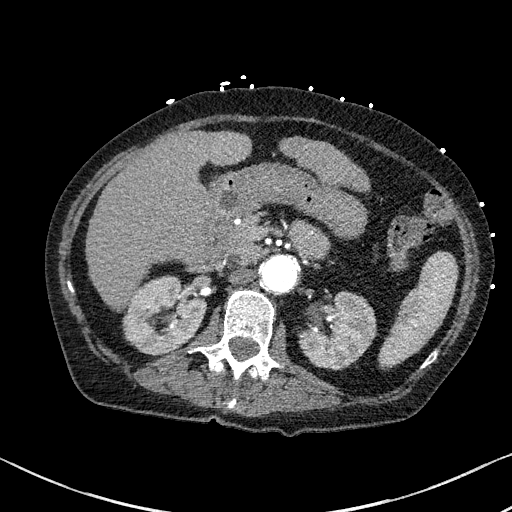

[Series 11: coronals · coronal · 0.68mm/px · 1 of 131 slices shown, 2 images]
[im 66/131  soft-tissue]
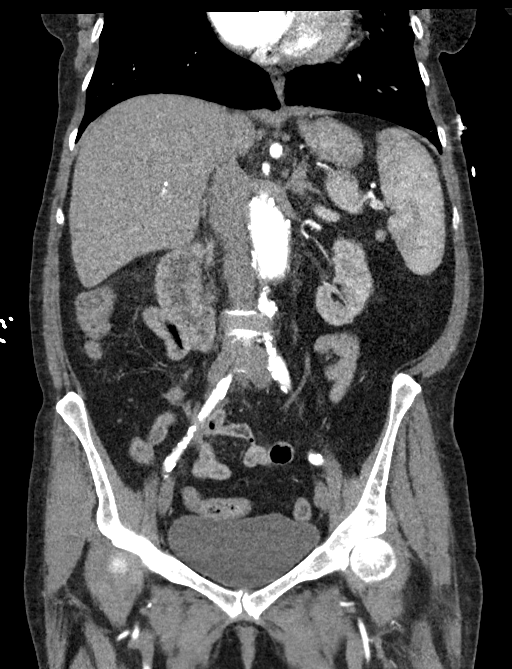
[im 66/131  bone]
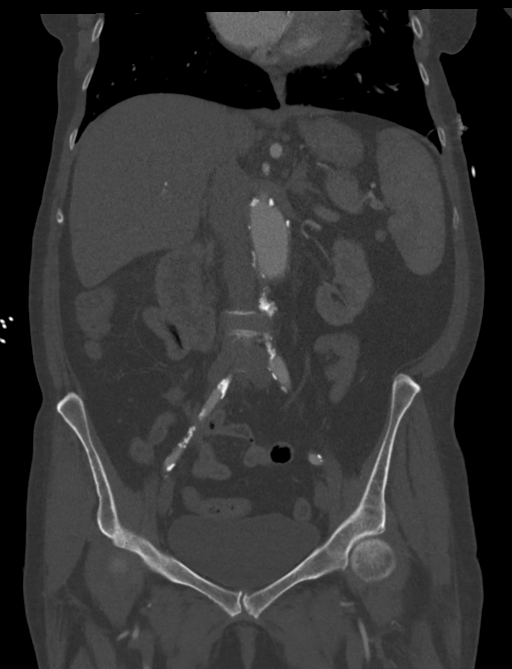

[Series 15: axial venous · axial · portal-venous · 0.71mm/px · z∈[+720,+990]mm · 4 of 91 slices shown, 9 images]
[im 19/91  soft-tissue]
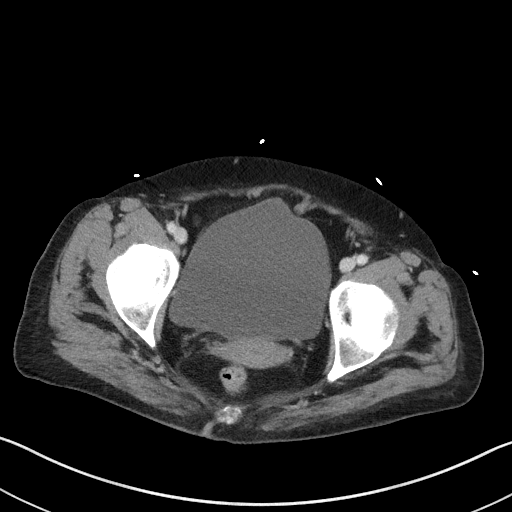
[im 19/91  lung]
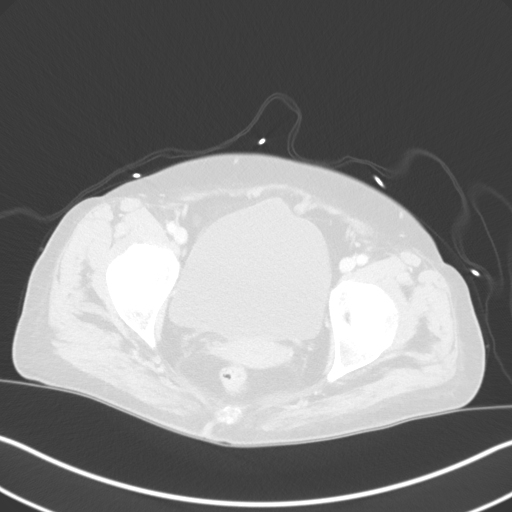
[im 19/91  bone]
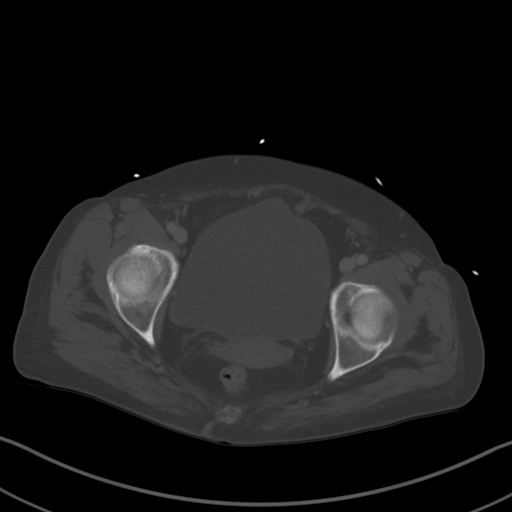
[im 37/91  soft-tissue]
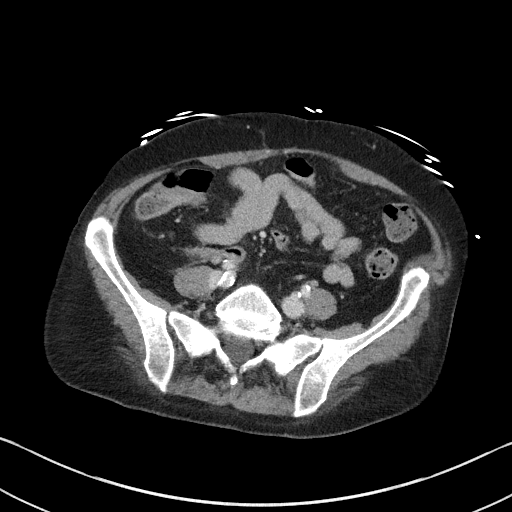
[im 37/91  lung]
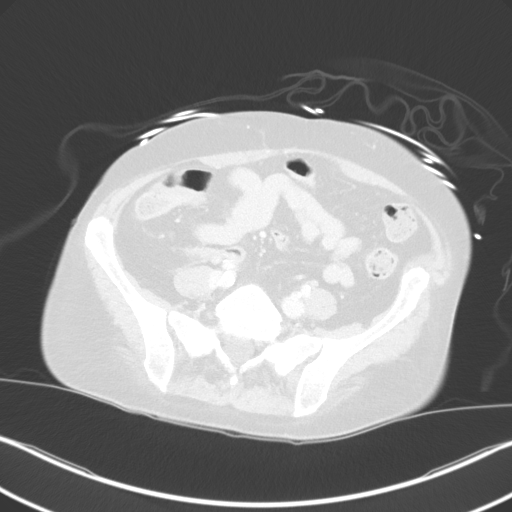
[im 55/91  soft-tissue]
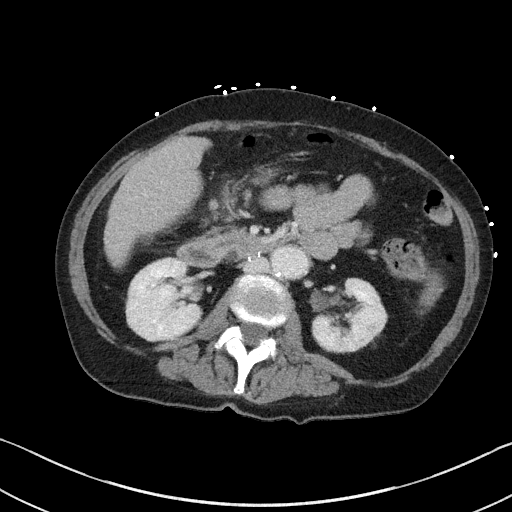
[im 55/91  lung]
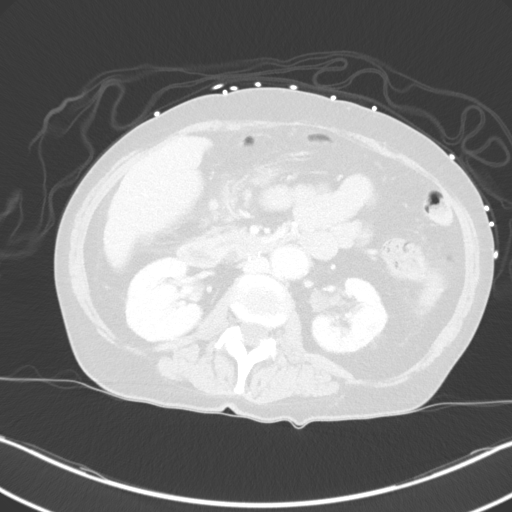
[im 73/91  soft-tissue]
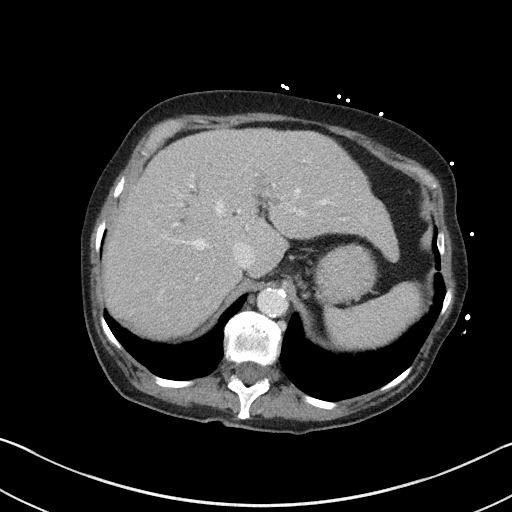
[im 73/91  lung]
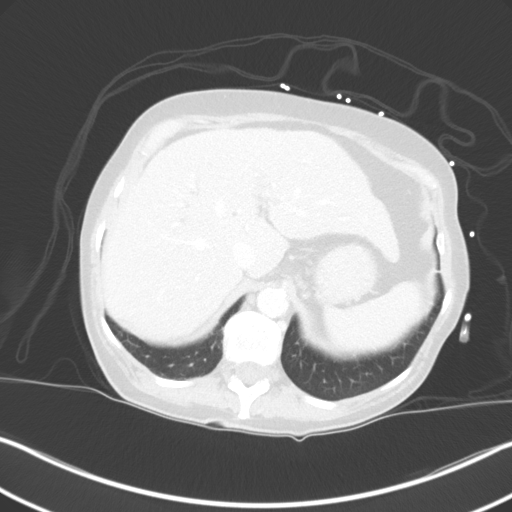

[11 of 46 positions shown; findings below may reference images not displayed]

FINDINGS: VASCULAR

Aorta: There is moderate atherosclerosis within the suprarenal
abdominal aorta. No flow limiting stenosis. There has been an aorto
bi-iliac bypass from prior abdominal aneurysm repair, which is
widely patent.

Celiac: Patent without evidence of aneurysm, dissection, vasculitis
or significant stenosis. Mild atherosclerosis at the origin.

SMA: There is moderate atheromatous plaque at the origin of the SMA,
with stenosis estimated greater than 70%. Remainder of the SMA is
widely patent with no evidence of aneurysm, dissection, or
vasculitis.

Renals: There is significant bilateral renal artery atherosclerosis,
stenosis estimated less than 50% however. No evidence of vasculitis
or dissection.

IMA: The IMA is not identified after previous aorto bi-iliac bypass.

Inflow: Patent without evidence of aneurysm, dissection, vasculitis
or significant stenosis. Moderate atherosclerosis without
significant stenosis.

Proximal Outflow: Bilateral common femoral and visualized portions
of the superficial and profunda femoral arteries are patent without
evidence of aneurysm, dissection, vasculitis or significant
stenosis.

Veins: No obvious venous abnormality within the limitations of this
arterial phase study.

Review of the MIP images confirms the above findings.

NON-VASCULAR

Lower chest: No acute pleural or parenchymal lung disease.

Hepatobiliary: Subtle nodularity of the liver capsule may reflect
cirrhosis. No focal liver abnormality is seen. Status post
cholecystectomy. No biliary dilatation.

Pancreas: Unremarkable. No pancreatic ductal dilatation or
surrounding inflammatory changes.

Spleen: Normal in size without focal abnormality.

Adrenals/Urinary Tract: Small left renal cyst. Otherwise the kidneys
enhance normally and symmetrically. No urinary tract calculi or
obstruction. The adrenals and bladder are grossly unremarkable.

Stomach/Bowel: No bowel obstruction or ileus. No bowel wall
thickening or inflammatory change. No intraluminal accumulation of
contrast to suggest active gastrointestinal bleeding.

Lymphatic: No pathologic adenopathy.

Reproductive: Uterus and bilateral adnexa are unremarkable.

Other: No free fluid or free gas.  No abdominal wall hernia.

Musculoskeletal: No acute or destructive bony lesions. Reconstructed
images demonstrate no additional findings.
IMPRESSION: VASCULAR

1. Postsurgical changes from prior aorto bi-iliac bypass for AAA
repair.
2. Significant atherosclerosis at the origin of the SMA, with
stenosis estimated greater than 70%. The distal SMA is widely patent
however.
3. No evidence of active gastrointestinal bleeding.
4.  Aortic Atherosclerosis (098I2-LCJ.J).

NON-VASCULAR

1. No acute intra-abdominal or intrapelvic process.
2. Subtle nodularity of the liver capsule which may reflect
underlying cirrhosis.

## 2021-10-23 DIAGNOSIS — R2689 Other abnormalities of gait and mobility: Secondary | ICD-10-CM | POA: Diagnosis not present

## 2021-10-23 DIAGNOSIS — S7290XD Unspecified fracture of unspecified femur, subsequent encounter for closed fracture with routine healing: Secondary | ICD-10-CM | POA: Diagnosis not present

## 2021-10-23 DIAGNOSIS — M6281 Muscle weakness (generalized): Secondary | ICD-10-CM | POA: Diagnosis not present

## 2021-10-30 DIAGNOSIS — M6281 Muscle weakness (generalized): Secondary | ICD-10-CM | POA: Diagnosis not present

## 2021-10-30 DIAGNOSIS — R2689 Other abnormalities of gait and mobility: Secondary | ICD-10-CM | POA: Diagnosis not present

## 2021-10-30 DIAGNOSIS — S7290XD Unspecified fracture of unspecified femur, subsequent encounter for closed fracture with routine healing: Secondary | ICD-10-CM | POA: Diagnosis not present

## 2021-11-01 DIAGNOSIS — K922 Gastrointestinal hemorrhage, unspecified: Secondary | ICD-10-CM | POA: Diagnosis not present

## 2021-11-01 DIAGNOSIS — I2510 Atherosclerotic heart disease of native coronary artery without angina pectoris: Secondary | ICD-10-CM | POA: Diagnosis not present

## 2021-11-01 DIAGNOSIS — E1165 Type 2 diabetes mellitus with hyperglycemia: Secondary | ICD-10-CM | POA: Diagnosis not present

## 2021-11-06 DIAGNOSIS — R809 Proteinuria, unspecified: Secondary | ICD-10-CM | POA: Diagnosis not present

## 2021-11-06 DIAGNOSIS — J441 Chronic obstructive pulmonary disease with (acute) exacerbation: Secondary | ICD-10-CM | POA: Diagnosis not present

## 2021-11-06 DIAGNOSIS — E1165 Type 2 diabetes mellitus with hyperglycemia: Secondary | ICD-10-CM | POA: Diagnosis not present

## 2021-11-06 DIAGNOSIS — I2510 Atherosclerotic heart disease of native coronary artery without angina pectoris: Secondary | ICD-10-CM | POA: Diagnosis not present

## 2021-11-06 DIAGNOSIS — D509 Iron deficiency anemia, unspecified: Secondary | ICD-10-CM | POA: Diagnosis not present

## 2021-11-06 DIAGNOSIS — M109 Gout, unspecified: Secondary | ICD-10-CM | POA: Diagnosis not present

## 2021-11-06 DIAGNOSIS — I739 Peripheral vascular disease, unspecified: Secondary | ICD-10-CM | POA: Diagnosis not present

## 2021-11-06 DIAGNOSIS — R7401 Elevation of levels of liver transaminase levels: Secondary | ICD-10-CM | POA: Diagnosis not present

## 2021-11-06 DIAGNOSIS — I714 Abdominal aortic aneurysm, without rupture, unspecified: Secondary | ICD-10-CM | POA: Diagnosis not present

## 2021-11-06 DIAGNOSIS — I10 Essential (primary) hypertension: Secondary | ICD-10-CM | POA: Diagnosis not present

## 2021-11-06 DIAGNOSIS — N182 Chronic kidney disease, stage 2 (mild): Secondary | ICD-10-CM | POA: Diagnosis not present

## 2021-11-13 DIAGNOSIS — S7290XD Unspecified fracture of unspecified femur, subsequent encounter for closed fracture with routine healing: Secondary | ICD-10-CM | POA: Diagnosis not present

## 2021-11-13 DIAGNOSIS — R2689 Other abnormalities of gait and mobility: Secondary | ICD-10-CM | POA: Diagnosis not present

## 2021-11-13 DIAGNOSIS — M6281 Muscle weakness (generalized): Secondary | ICD-10-CM | POA: Diagnosis not present

## 2021-11-26 DIAGNOSIS — Z95810 Presence of automatic (implantable) cardiac defibrillator: Secondary | ICD-10-CM | POA: Diagnosis not present

## 2021-11-27 DIAGNOSIS — S7290XD Unspecified fracture of unspecified femur, subsequent encounter for closed fracture with routine healing: Secondary | ICD-10-CM | POA: Diagnosis not present

## 2021-11-27 DIAGNOSIS — M6281 Muscle weakness (generalized): Secondary | ICD-10-CM | POA: Diagnosis not present

## 2021-11-27 DIAGNOSIS — R2689 Other abnormalities of gait and mobility: Secondary | ICD-10-CM | POA: Diagnosis not present

## 2021-11-29 DIAGNOSIS — E119 Type 2 diabetes mellitus without complications: Secondary | ICD-10-CM | POA: Diagnosis not present

## 2021-12-05 DIAGNOSIS — M6281 Muscle weakness (generalized): Secondary | ICD-10-CM | POA: Diagnosis not present

## 2021-12-05 DIAGNOSIS — R2689 Other abnormalities of gait and mobility: Secondary | ICD-10-CM | POA: Diagnosis not present

## 2021-12-05 DIAGNOSIS — S7290XD Unspecified fracture of unspecified femur, subsequent encounter for closed fracture with routine healing: Secondary | ICD-10-CM | POA: Diagnosis not present

## 2021-12-12 DIAGNOSIS — H2511 Age-related nuclear cataract, right eye: Secondary | ICD-10-CM | POA: Diagnosis not present

## 2021-12-19 DIAGNOSIS — M6281 Muscle weakness (generalized): Secondary | ICD-10-CM | POA: Diagnosis not present

## 2021-12-19 DIAGNOSIS — R2689 Other abnormalities of gait and mobility: Secondary | ICD-10-CM | POA: Diagnosis not present

## 2021-12-19 DIAGNOSIS — S7290XD Unspecified fracture of unspecified femur, subsequent encounter for closed fracture with routine healing: Secondary | ICD-10-CM | POA: Diagnosis not present

## 2021-12-26 DIAGNOSIS — M6281 Muscle weakness (generalized): Secondary | ICD-10-CM | POA: Diagnosis not present

## 2021-12-26 DIAGNOSIS — S7290XD Unspecified fracture of unspecified femur, subsequent encounter for closed fracture with routine healing: Secondary | ICD-10-CM | POA: Diagnosis not present

## 2021-12-26 DIAGNOSIS — R2689 Other abnormalities of gait and mobility: Secondary | ICD-10-CM | POA: Diagnosis not present

## 2021-12-31 ENCOUNTER — Encounter: Payer: Self-pay | Admitting: Ophthalmology

## 2022-01-02 DIAGNOSIS — R2689 Other abnormalities of gait and mobility: Secondary | ICD-10-CM | POA: Diagnosis not present

## 2022-01-02 DIAGNOSIS — S7290XD Unspecified fracture of unspecified femur, subsequent encounter for closed fracture with routine healing: Secondary | ICD-10-CM | POA: Diagnosis not present

## 2022-01-02 DIAGNOSIS — M6281 Muscle weakness (generalized): Secondary | ICD-10-CM | POA: Diagnosis not present

## 2022-01-03 NOTE — Discharge Instructions (Signed)

## 2022-01-07 ENCOUNTER — Ambulatory Visit
Admission: RE | Admit: 2022-01-07 | Discharge: 2022-01-07 | Disposition: A | Discharge status: Home / Self Care | Payer: Medicare Other | Attending: Ophthalmology | Admitting: Ophthalmology

## 2022-01-07 ENCOUNTER — Encounter: Payer: Self-pay | Admitting: Ophthalmology

## 2022-01-07 ENCOUNTER — Ambulatory Visit: Payer: Medicare Other | Admitting: Anesthesiology

## 2022-01-07 ENCOUNTER — Other Ambulatory Visit: Payer: Self-pay

## 2022-01-07 ENCOUNTER — Encounter
Admission: RE | Disposition: A | Discharge status: Home / Self Care | Payer: Self-pay | Source: Home / Self Care | Attending: Ophthalmology

## 2022-01-07 DIAGNOSIS — Z87891 Personal history of nicotine dependence: Secondary | ICD-10-CM | POA: Insufficient documentation

## 2022-01-07 DIAGNOSIS — E1136 Type 2 diabetes mellitus with diabetic cataract: Secondary | ICD-10-CM | POA: Diagnosis not present

## 2022-01-07 DIAGNOSIS — E1151 Type 2 diabetes mellitus with diabetic peripheral angiopathy without gangrene: Secondary | ICD-10-CM | POA: Insufficient documentation

## 2022-01-07 DIAGNOSIS — F32A Depression, unspecified: Secondary | ICD-10-CM | POA: Insufficient documentation

## 2022-01-07 DIAGNOSIS — Z955 Presence of coronary angioplasty implant and graft: Secondary | ICD-10-CM | POA: Diagnosis not present

## 2022-01-07 DIAGNOSIS — E785 Hyperlipidemia, unspecified: Secondary | ICD-10-CM | POA: Insufficient documentation

## 2022-01-07 DIAGNOSIS — Z951 Presence of aortocoronary bypass graft: Secondary | ICD-10-CM | POA: Insufficient documentation

## 2022-01-07 DIAGNOSIS — I2510 Atherosclerotic heart disease of native coronary artery without angina pectoris: Secondary | ICD-10-CM | POA: Insufficient documentation

## 2022-01-07 DIAGNOSIS — I10 Essential (primary) hypertension: Secondary | ICD-10-CM | POA: Insufficient documentation

## 2022-01-07 DIAGNOSIS — H25811 Combined forms of age-related cataract, right eye: Secondary | ICD-10-CM | POA: Diagnosis not present

## 2022-01-07 DIAGNOSIS — H2511 Age-related nuclear cataract, right eye: Secondary | ICD-10-CM | POA: Insufficient documentation

## 2022-01-07 HISTORY — DX: Presence of dental prosthetic device (complete) (partial): Z97.2

## 2022-01-07 HISTORY — PX: CATARACT EXTRACTION W/PHACO: SHX586

## 2022-01-07 LAB — GLUCOSE, CAPILLARY
Glucose-Capillary: 192 mg/dL — ABNORMAL HIGH (ref 70–99)
Glucose-Capillary: 174 mg/dL — ABNORMAL HIGH (ref 70–99)

## 2022-01-07 SURGERY — PHACOEMULSIFICATION, CATARACT, WITH IOL INSERTION
Anesthesia: Monitor Anesthesia Care | Site: Eye | Laterality: Right

## 2022-01-07 MED ORDER — ACETAMINOPHEN 325 MG PO TABS: 325.0000 mg | ORAL_TABLET | ORAL | Status: DC | PRN

## 2022-01-07 MED ORDER — SIGHTPATH DOSE#1 BSS IO SOLN
INTRAOCULAR | Status: DC | PRN
  Administered 2022-01-07: 65 mL via OPHTHALMIC

## 2022-01-07 MED ORDER — ONDANSETRON HCL 4 MG/2ML IJ SOLN: 4.0000 mg | Freq: Once | INTRAMUSCULAR | Status: DC | PRN

## 2022-01-07 MED ORDER — TETRACAINE HCL 0.5 % OP SOLN
1.0000 [drp] | OPHTHALMIC | Status: DC | PRN
  Administered 2022-01-07 (×3): 1 [drp] via OPHTHALMIC

## 2022-01-07 MED ORDER — SIGHTPATH DOSE#1 SODIUM HYALURONATE 10 MG/ML IO SOLUTION
PREFILLED_SYRINGE | INTRAOCULAR | Status: DC | PRN
  Administered 2022-01-07: 0.85 mL via INTRAOCULAR

## 2022-01-07 MED ORDER — SIGHTPATH DOSE#1 BSS IO SOLN
INTRAOCULAR | Status: DC | PRN
  Administered 2022-01-07: 15 mL

## 2022-01-07 MED ORDER — ACETAMINOPHEN 160 MG/5ML PO SOLN: 325.0000 mg | ORAL | Status: DC | PRN

## 2022-01-07 MED ORDER — ARMC OPHTHALMIC DILATING DROPS
1.0000 "application " | OPHTHALMIC | Status: DC | PRN
  Administered 2022-01-07 (×3): 1 via OPHTHALMIC

## 2022-01-07 MED ORDER — LIDOCAINE HCL (PF) 2 % IJ SOLN
INTRAOCULAR | Status: DC | PRN
  Administered 2022-01-07: 1 mL via INTRAOCULAR

## 2022-01-07 MED ORDER — FENTANYL CITRATE (PF) 100 MCG/2ML IJ SOLN
INTRAMUSCULAR | Status: DC | PRN
  Administered 2022-01-07: 50 ug via INTRAVENOUS

## 2022-01-07 MED ORDER — MOXIFLOXACIN HCL 0.5 % OP SOLN
OPHTHALMIC | Status: DC | PRN
  Administered 2022-01-07: 0.2 mL via OPHTHALMIC

## 2022-01-07 MED ORDER — MIDAZOLAM HCL 2 MG/2ML IJ SOLN
INTRAMUSCULAR | Status: DC | PRN
  Administered 2022-01-07 (×2): 1 mg via INTRAVENOUS

## 2022-01-07 MED ORDER — SIGHTPATH DOSE#1 SODIUM HYALURONATE 23 MG/ML IO SOLUTION
PREFILLED_SYRINGE | INTRAOCULAR | Status: DC | PRN
  Administered 2022-01-07: 0.6 mL via INTRAOCULAR

## 2022-01-07 SURGICAL SUPPLY — 20 items

## 2022-01-07 NOTE — Anesthesia Postprocedure Evaluation (Signed)
Anesthesia Post Note  Patient: Alicia Ortega  Procedure(s) Performed: CATARACT EXTRACTION PHACO AND INTRAOCULAR LENS PLACEMENT (IOC) RIGHT (Right: Eye)     Patient location during evaluation: PACU Anesthesia Type: MAC Level of consciousness: awake Pain management: pain level controlled Vital Signs Assessment: post-procedure vital signs reviewed and stable Respiratory status: respiratory function stable Cardiovascular status: stable Postop Assessment: no apparent nausea or vomiting Anesthetic complications: no   No notable events documented.  Veda Canning

## 2022-01-07 NOTE — H&P (Signed)
[name]   Primary Care Physician:  Celene Squibb, MD Ophthalmologist: Dr. Benay Pillow  Pre-Procedure History & Physical: HPI:  Alicia Ortega is a 71 y.o. female here for cataract surgery.   Past Medical History:  Diagnosis Date   AAA (abdominal aortic aneurysm) ([place])    Coronary artery disease    Depression    Diabetes mellitus without complication ([place])    HLD (hyperlipidemia)    Peripheral arterial disease ([place])    Wears dentures    full upper and lower    Past Surgical History:  Procedure Laterality Date   ABDOMINAL AORTIC ANEURYSM REPAIR  10/2020   CESAREAN SECTION     1977 and 1979   CHOLECYSTECTOMY     COLONOSCOPY N/A 03/06/2016   Procedure: COLONOSCOPY;  Surgeon: Rogene Houston, MD;  Location: [facility];  Service: Endoscopy;  Laterality: N/A;  930   CORONARY ANGIOPLASTY WITH STENT PLACEMENT     stents placed   CORONARY ARTERY BYPASS GRAFT  2005   [facility]  3 vessel   FEMUR SURGERY Left 05/03/2021    Prior to Admission medications   Medication Sig Start Date End Date Taking? Authorizing Provider  albuterol (PROVENTIL HFA;VENTOLIN HFA) 108 (90 Base) MCG/ACT inhaler Inhale 1-2 puffs into the lungs every 6 (six) hours as needed for wheezing or shortness of breath.   Yes [provider]  amLODipine (NORVASC) 10 MG tablet Take 10 mg by mouth daily.   Yes [provider]  buPROPion (WELLBUTRIN XL) 150 MG 24 hr tablet Take 150 mg by mouth daily.   Yes [provider]  clopidogrel (PLAVIX) 75 MG tablet Take 75 mg by mouth daily.   Yes [provider]  colchicine 0.6 MG tablet Take 0.6 mg by mouth daily as needed (flare).   Yes [provider]  diazepam (VALIUM) 2 MG tablet Take 2 mg by mouth every 6 (six) hours as needed for anxiety.   Yes [provider]  DULoxetine (CYMBALTA) 30 MG capsule Take 30 mg by mouth daily.   Yes [provider]  fenofibrate micronized (LOFIBRA) 134 MG capsule Take 134 mg  by mouth daily before breakfast.   Yes [provider]  levocetirizine (XYZAL) 5 MG tablet Take 5 mg by mouth at bedtime as needed for allergies.   Yes [provider]  loratadine (CLARITIN) 10 MG tablet Take 10 mg by mouth daily.   Yes [provider]  metFORMIN (GLUCOPHAGE) 1000 MG tablet Take 1,000 mg by mouth 2 (two) times daily.   Yes [provider]  metoprolol tartrate (LOPRESSOR) 50 MG tablet Take 25 mg by mouth 2 (two) times daily.   Yes [provider]  Multiple Vitamin (MULTIVITAMIN WITH MINERALS) TABS tablet Take 1 tablet by mouth daily.   Yes [provider]  oxyCODONE (OXY IR/ROXICODONE) 5 MG immediate release tablet Take 5 mg by mouth every 4 (four) hours as needed for severe pain.   Yes [provider]  pantoprazole (PROTONIX) 40 MG tablet Take 1 tablet (40 mg total) by mouth 2 (two) times daily. 05/19/21  Yes Tat, Shanon Brow, MD  rosuvastatin (CRESTOR) 20 MG tablet Take 20 mg by mouth daily.   Yes [provider]  Vitamin D3 (VITAMIN D) 25 MCG tablet Take 1,000 Units by mouth daily.   Yes [provider]  zolpidem (AMBIEN) 10 MG tablet Take 10 mg by mouth at bedtime.   Yes [provider]  gabapentin (NEURONTIN) 300  MG capsule Take 300 mg by mouth 3 (three) times daily. Patient not taking: Reported on 12/31/2021    [provider]    Allergies as of 11/30/2021 - Review Complete 05/18/2021  Allergen Reaction Noted   Codeine Itching and Rash 10/23/2013   Hydrocodone Itching and Rash 10/23/2013   Lipitor [atorvastatin] Itching and Rash 10/23/2013    Family History  Problem Relation Age of Onset   Parkinson's disease Brother     Social History   Socioeconomic History   Marital status: Divorced    Spouse name: Not on file   Number of children: Not on file   Years of education: Not on file   Highest education level: Not on file  Occupational History   Not on file  Tobacco Use    Smoking status: Former    Types: Cigarettes    Quit date: 10/2020    Years since quitting: 1.1   Smokeless tobacco: Never  Vaping Use   Vaping Use: Never used  Substance and Sexual Activity   Alcohol use: Yes    Comment: rarely   Drug use: No   Sexual activity: Not on file  Other Topics Concern   Not on file  Social History Narrative   Not on file   Social Determinants of Health   Financial Resource Strain: Not on file  Food Insecurity: Not on file  Transportation Needs: Not on file  Physical Activity: Not on file  Stress: Not on file  Social Connections: Not on file  Intimate Partner Violence: Not on file    Review of Systems: See HPI, otherwise negative ROS  Physical Exam: BP (!) 175/68   Pulse 81   Temp (!) 97.5 F (36.4 C) (Temporal)   Resp 18   Ht '5\' 2"'$  (1.575 m)   Wt 52.2 kg   SpO2 99%   BMI 21.03 kg/m  General:   Alert, cooperative in NAD Head:  Normocephalic and atraumatic. Respiratory:  Normal work of breathing. Cardiovascular:  RRR  Impression/Plan: Alicia Ortega is here for cataract surgery.  Risks, benefits, limitations, and alternatives regarding cataract surgery have been reviewed with the patient.  Questions have been answered.  All parties agreeable.   Benay Pillow, MD  01/07/2022, 8:40 AM

## 2022-01-07 NOTE — Anesthesia Procedure Notes (Signed)
Procedure Name: MAC Date/Time: 01/07/2022 8:52 AM  Performed by: Jeannene Patella, CRNAPre-anesthesia Checklist: Patient identified, Emergency Drugs available, Suction available, Timeout performed and Patient being monitored Patient Re-evaluated:Patient Re-evaluated prior to induction Oxygen Delivery Method: Nasal cannula Placement Confirmation: positive ETCO2

## 2022-01-07 NOTE — Transfer of Care (Signed)
Immediate Anesthesia Transfer of Care Note  Patient: Alicia Ortega  Procedure(s) Performed: CATARACT EXTRACTION PHACO AND INTRAOCULAR LENS PLACEMENT (IOC) RIGHT (Right: Eye)  Patient Location: PACU  Anesthesia Type: MAC  Level of Consciousness: awake, alert  and patient cooperative  Airway and Oxygen Therapy: Patient Spontanous Breathing and Patient connected to supplemental oxygen  Post-op Assessment: Post-op Vital signs reviewed, Patient's Cardiovascular Status Stable, Respiratory Function Stable, Patent Airway and No signs of Nausea or vomiting  Post-op Vital Signs: Reviewed and stable  Complications: No notable events documented.

## 2022-01-07 NOTE — Op Note (Signed)
OPERATIVE NOTE  PETULA ROTOLO [id number] 01/07/2022   PREOPERATIVE DIAGNOSIS:  Nuclear sclerotic cataract right eye.  H25.11   POSTOPERATIVE DIAGNOSIS:    Nuclear sclerotic cataract right eye.     PROCEDURE:  Phacoemusification with posterior chamber intraocular lens placement of the right eye   LENS:   Implant Name Type Inv. Item Serial No. Manufacturer Lot No. LRB No. Used Action  LENS IOL TECNIS EYHANCE 25.5 - [id number] Intraocular Lens LENS IOL TECNIS EYHANCE 25.5 [id number] [employer]  Right 1 Implanted       Procedure(s) with comments: CATARACT EXTRACTION PHACO AND INTRAOCULAR LENS PLACEMENT (IOC) RIGHT (Right) - 2.86 0:21.9  DIB00 +25.5   ULTRASOUND TIME: 0 minutes 21 seconds.  CDE 2.86   SURGEON:  Benay Pillow, MD, MPH  ANESTHESIOLOGIST: Anesthesiologist: Veda Canning, MD CRNA: Jeannene Patella, CRNA   ANESTHESIA:  Topical with tetracaine drops augmented with 1% preservative-free intracameral lidocaine.  ESTIMATED BLOOD LOSS: less than 1 mL.   COMPLICATIONS:  None.   DESCRIPTION OF PROCEDURE:  The patient was identified in the holding room and transported to the operating room and placed in the supine position under the operating microscope.  The right eye was identified as the operative eye and it was prepped and draped in the usual sterile ophthalmic fashion.   A 1.0 millimeter clear-corneal paracentesis was made at the 10:30 position. 0.5 ml of preservative-free 1% lidocaine with epinephrine was injected into the anterior chamber.  The anterior chamber was filled with Healon 5 viscoelastic.  A 2.4 millimeter keratome was used to make a near-clear corneal incision at the 8:00 position.  A curvilinear capsulorrhexis was made with a cystotome and capsulorrhexis forceps.  Balanced salt solution was used to hydrodissect and hydrodelineate the nucleus.   Phacoemulsification was then used in stop and chop fashion to remove the lens nucleus and epinucleus.  The  remaining cortex was then removed using the irrigation and aspiration handpiece. Healon was then placed into the capsular bag to distend it for lens placement.  A lens was then injected into the capsular bag.  The remaining viscoelastic was aspirated.   Wounds were hydrated with balanced salt solution.  The anterior chamber was inflated to a physiologic pressure with balanced salt solution.   Intracameral vigamox 0.1 mL undiluted was injected into the eye and a drop placed onto the ocular surface.  No wound leaks were noted.  The patient was taken to the recovery room in stable condition without complications of anesthesia or surgery  Benay Pillow 01/07/2022, 9:06 AM

## 2022-01-07 NOTE — Anesthesia Preprocedure Evaluation (Signed)
Anesthesia Evaluation  Patient identified by MRN, date of birth, ID band Patient awake    Reviewed: Allergy & Precautions, NPO status   Airway Mallampati: II  TM Distance: >3 FB     Dental   Pulmonary former smoker,    Pulmonary exam normal        Cardiovascular hypertension, + CAD, + CABG (2005) and + Peripheral Vascular Disease   Rhythm:Regular Rate:Normal  HLD  AAA - s/p repair 2022   Neuro/Psych PSYCHIATRIC DISORDERS Depression    GI/Hepatic   Endo/Other  diabetes, Type 2  Renal/GU      Musculoskeletal   Abdominal   Peds  Hematology   Anesthesia Other Findings   Reproductive/Obstetrics                             Anesthesia Physical Anesthesia Plan  ASA: 3  Anesthesia Plan: MAC   Post-op Pain Management: Minimal or no pain anticipated   Induction: Intravenous  PONV Risk Score and Plan: TIVA, Midazolam and Treatment may vary due to age or medical condition  Airway Management Planned: Natural Airway and Nasal Cannula  Additional Equipment:   Intra-op Plan:   Post-operative Plan:   Informed Consent: I have reviewed the patients History and Physical, chart, labs and discussed the procedure including the risks, benefits and alternatives for the proposed anesthesia with the patient or authorized representative who has indicated his/her understanding and acceptance.       Plan Discussed with: CRNA  Anesthesia Plan Comments:         Anesthesia Quick Evaluation

## 2022-01-08 ENCOUNTER — Encounter: Payer: Self-pay | Admitting: Ophthalmology

## 2022-01-08 ENCOUNTER — Other Ambulatory Visit: Payer: Self-pay

## 2022-01-17 DIAGNOSIS — H2512 Age-related nuclear cataract, left eye: Secondary | ICD-10-CM | POA: Diagnosis not present

## 2022-01-21 ENCOUNTER — Ambulatory Visit: Payer: Medicare Other | Admitting: Anesthesiology

## 2022-01-21 ENCOUNTER — Encounter
Admission: RE | Disposition: A | Discharge status: Home / Self Care | Payer: Self-pay | Source: Home / Self Care | Attending: Ophthalmology

## 2022-01-21 ENCOUNTER — Other Ambulatory Visit: Payer: Self-pay

## 2022-01-21 ENCOUNTER — Ambulatory Visit
Admission: RE | Admit: 2022-01-21 | Discharge: 2022-01-21 | Disposition: A | Discharge status: Home / Self Care | Payer: Medicare Other | Attending: Ophthalmology | Admitting: Ophthalmology

## 2022-01-21 DIAGNOSIS — F32A Depression, unspecified: Secondary | ICD-10-CM | POA: Diagnosis not present

## 2022-01-21 DIAGNOSIS — Z951 Presence of aortocoronary bypass graft: Secondary | ICD-10-CM | POA: Insufficient documentation

## 2022-01-21 DIAGNOSIS — E1136 Type 2 diabetes mellitus with diabetic cataract: Secondary | ICD-10-CM | POA: Insufficient documentation

## 2022-01-21 DIAGNOSIS — Z955 Presence of coronary angioplasty implant and graft: Secondary | ICD-10-CM | POA: Insufficient documentation

## 2022-01-21 DIAGNOSIS — E785 Hyperlipidemia, unspecified: Secondary | ICD-10-CM | POA: Insufficient documentation

## 2022-01-21 DIAGNOSIS — I2510 Atherosclerotic heart disease of native coronary artery without angina pectoris: Secondary | ICD-10-CM | POA: Insufficient documentation

## 2022-01-21 DIAGNOSIS — E1151 Type 2 diabetes mellitus with diabetic peripheral angiopathy without gangrene: Secondary | ICD-10-CM | POA: Insufficient documentation

## 2022-01-21 DIAGNOSIS — H2512 Age-related nuclear cataract, left eye: Secondary | ICD-10-CM | POA: Insufficient documentation

## 2022-01-21 DIAGNOSIS — Z87891 Personal history of nicotine dependence: Secondary | ICD-10-CM | POA: Insufficient documentation

## 2022-01-21 DIAGNOSIS — I10 Essential (primary) hypertension: Secondary | ICD-10-CM

## 2022-01-21 DIAGNOSIS — H25812 Combined forms of age-related cataract, left eye: Secondary | ICD-10-CM | POA: Diagnosis not present

## 2022-01-21 HISTORY — PX: CATARACT EXTRACTION W/PHACO: SHX586

## 2022-01-21 LAB — GLUCOSE, CAPILLARY
Glucose-Capillary: 177 mg/dL — ABNORMAL HIGH (ref 70–99)
Glucose-Capillary: 143 mg/dL — ABNORMAL HIGH (ref 70–99)

## 2022-01-21 SURGERY — PHACOEMULSIFICATION, CATARACT, WITH IOL INSERTION
Anesthesia: Monitor Anesthesia Care | Laterality: Left

## 2022-01-21 MED ORDER — MOXIFLOXACIN HCL 0.5 % OP SOLN
OPHTHALMIC | Status: DC | PRN
  Administered 2022-01-21: 0.2 mL via OPHTHALMIC

## 2022-01-21 MED ORDER — ARMC OPHTHALMIC DILATING DROPS
1.0000 "application " | OPHTHALMIC | Status: DC | PRN
  Administered 2022-01-21 (×3): 1 via OPHTHALMIC

## 2022-01-21 MED ORDER — SIGHTPATH DOSE#1 SODIUM HYALURONATE 23 MG/ML IO SOLUTION
PREFILLED_SYRINGE | INTRAOCULAR | Status: DC | PRN
  Administered 2022-01-21: 0.6 mL via INTRAOCULAR

## 2022-01-21 MED ORDER — SIGHTPATH DOSE#1 BSS IO SOLN
INTRAOCULAR | Status: DC | PRN
  Administered 2022-01-21: 70 mL via OPHTHALMIC

## 2022-01-21 MED ORDER — ACETAMINOPHEN 325 MG PO TABS: 650.0000 mg | ORAL_TABLET | ORAL | Status: DC | PRN

## 2022-01-21 MED ORDER — MIDAZOLAM HCL 2 MG/2ML IJ SOLN
INTRAMUSCULAR | Status: DC | PRN
  Administered 2022-01-21: 2 mg via INTRAVENOUS

## 2022-01-21 MED ORDER — ONDANSETRON HCL 4 MG/2ML IJ SOLN
4.0000 mg | Freq: Once | INTRAMUSCULAR | Status: AC | PRN
Start: 2022-01-21 — End: 2022-01-21
  Administered 2022-01-21: 4 mg via INTRAVENOUS

## 2022-01-21 MED ORDER — LACTATED RINGERS IV SOLN: INTRAVENOUS | Status: DC

## 2022-01-21 MED ORDER — TETRACAINE HCL 0.5 % OP SOLN
1.0000 [drp] | OPHTHALMIC | Status: DC | PRN
  Administered 2022-01-21 (×3): 1 [drp] via OPHTHALMIC

## 2022-01-21 MED ORDER — LIDOCAINE HCL (PF) 2 % IJ SOLN
INTRAOCULAR | Status: DC | PRN
  Administered 2022-01-21: 1 mL via INTRAOCULAR

## 2022-01-21 MED ORDER — ACETAMINOPHEN 160 MG/5ML PO SOLN: 325.0000 mg | ORAL | Status: DC | PRN

## 2022-01-21 MED ORDER — SIGHTPATH DOSE#1 BSS IO SOLN
INTRAOCULAR | Status: DC | PRN
  Administered 2022-01-21: 15 mL

## 2022-01-21 MED ORDER — FENTANYL CITRATE (PF) 100 MCG/2ML IJ SOLN
INTRAMUSCULAR | Status: DC | PRN
  Administered 2022-01-21: 50 ug via INTRAVENOUS

## 2022-01-21 MED ORDER — SIGHTPATH DOSE#1 SODIUM HYALURONATE 10 MG/ML IO SOLUTION
PREFILLED_SYRINGE | INTRAOCULAR | Status: DC | PRN
  Administered 2022-01-21: 0.85 mL via INTRAOCULAR

## 2022-01-21 SURGICAL SUPPLY — 14 items
CATARACT SUITE SIGHTPATH (MISCELLANEOUS) ×2 IMPLANT
DISSECTOR HYDRO NUCLEUS 50X22 (MISCELLANEOUS) ×2 IMPLANT
FEE CATARACT SUITE SIGHTPATH (MISCELLANEOUS) ×1 IMPLANT
GLOVE SURG GAMMEX PI TX LF 7.5 (GLOVE) ×2 IMPLANT
GLOVE SURG SYN 8.5  E (GLOVE) ×2
GLOVE SURG SYN 8.5 E (GLOVE) ×1 IMPLANT
GLOVE SURG SYN 8.5 PF PI (GLOVE) ×1 IMPLANT
LENS IOL TECNIS EYHANCE 24.5 (Intraocular Lens) ×1 IMPLANT
NDL FILTER BLUNT 18X1 1/2 (NEEDLE) ×1 IMPLANT
NEEDLE FILTER BLUNT 18X 1/2SAF (NEEDLE) ×1
NEEDLE FILTER BLUNT 18X1 1/2 (NEEDLE) ×1 IMPLANT
SYR 3ML LL SCALE MARK (SYRINGE) ×2 IMPLANT
SYR 5ML LL (SYRINGE) ×2 IMPLANT
WATER STERILE IRR 250ML POUR (IV SOLUTION) ×2 IMPLANT

## 2022-01-21 NOTE — Anesthesia Postprocedure Evaluation (Signed)
Anesthesia Post Note  Patient: Alicia Ortega  Procedure(s) Performed: CATARACT EXTRACTION PHACO AND INTRAOCULAR LENS PLACEMENT (IOC) LEFT DIABETIC (Left)     Patient location during evaluation: PACU Anesthesia Type: MAC Level of consciousness: awake and alert Pain management: pain level controlled Vital Signs Assessment: post-procedure vital signs reviewed and stable Respiratory status: spontaneous breathing, nonlabored ventilation, respiratory function stable and patient connected to nasal cannula oxygen Cardiovascular status: stable and blood pressure returned to baseline Postop Assessment: no apparent nausea or vomiting Anesthetic complications: no   No notable events documented.  Kaleb Sek A  Rollo Farquhar

## 2022-01-22 ENCOUNTER — Encounter: Payer: Self-pay | Admitting: Ophthalmology

## 2022-02-08 DIAGNOSIS — E1165 Type 2 diabetes mellitus with hyperglycemia: Secondary | ICD-10-CM | POA: Diagnosis not present

## 2022-02-08 DIAGNOSIS — K922 Gastrointestinal hemorrhage, unspecified: Secondary | ICD-10-CM | POA: Diagnosis not present

## 2022-02-08 DIAGNOSIS — I2510 Atherosclerotic heart disease of native coronary artery without angina pectoris: Secondary | ICD-10-CM | POA: Diagnosis not present

## 2022-02-15 DIAGNOSIS — N182 Chronic kidney disease, stage 2 (mild): Secondary | ICD-10-CM | POA: Diagnosis not present

## 2022-02-15 DIAGNOSIS — R7401 Elevation of levels of liver transaminase levels: Secondary | ICD-10-CM | POA: Diagnosis not present

## 2022-02-15 DIAGNOSIS — R809 Proteinuria, unspecified: Secondary | ICD-10-CM | POA: Diagnosis not present

## 2022-02-15 DIAGNOSIS — I714 Abdominal aortic aneurysm, without rupture, unspecified: Secondary | ICD-10-CM | POA: Diagnosis not present

## 2022-02-15 DIAGNOSIS — F17201 Nicotine dependence, unspecified, in remission: Secondary | ICD-10-CM | POA: Diagnosis not present

## 2022-02-15 DIAGNOSIS — J441 Chronic obstructive pulmonary disease with (acute) exacerbation: Secondary | ICD-10-CM | POA: Diagnosis not present

## 2022-02-15 DIAGNOSIS — E1165 Type 2 diabetes mellitus with hyperglycemia: Secondary | ICD-10-CM | POA: Diagnosis not present

## 2022-02-15 DIAGNOSIS — I10 Essential (primary) hypertension: Secondary | ICD-10-CM | POA: Diagnosis not present

## 2022-02-15 DIAGNOSIS — D509 Iron deficiency anemia, unspecified: Secondary | ICD-10-CM | POA: Diagnosis not present

## 2022-02-15 DIAGNOSIS — M109 Gout, unspecified: Secondary | ICD-10-CM | POA: Diagnosis not present

## 2022-02-15 DIAGNOSIS — I2510 Atherosclerotic heart disease of native coronary artery without angina pectoris: Secondary | ICD-10-CM | POA: Diagnosis not present

## 2022-02-18 DIAGNOSIS — J449 Chronic obstructive pulmonary disease, unspecified: Secondary | ICD-10-CM | POA: Diagnosis not present

## 2022-02-18 DIAGNOSIS — Z79899 Other long term (current) drug therapy: Secondary | ICD-10-CM | POA: Diagnosis not present

## 2022-02-18 DIAGNOSIS — Z7902 Long term (current) use of antithrombotics/antiplatelets: Secondary | ICD-10-CM | POA: Diagnosis not present

## 2022-02-18 DIAGNOSIS — I10 Essential (primary) hypertension: Secondary | ICD-10-CM | POA: Diagnosis not present

## 2022-02-18 DIAGNOSIS — J439 Emphysema, unspecified: Secondary | ICD-10-CM | POA: Diagnosis not present

## 2022-02-18 DIAGNOSIS — Z951 Presence of aortocoronary bypass graft: Secondary | ICD-10-CM | POA: Diagnosis not present

## 2022-02-18 DIAGNOSIS — I2510 Atherosclerotic heart disease of native coronary artery without angina pectoris: Secondary | ICD-10-CM | POA: Diagnosis not present

## 2022-02-18 DIAGNOSIS — Z6820 Body mass index (BMI) 20.0-20.9, adult: Secondary | ICD-10-CM | POA: Diagnosis not present

## 2022-02-18 DIAGNOSIS — R0602 Shortness of breath: Secondary | ICD-10-CM | POA: Diagnosis not present

## 2022-02-18 DIAGNOSIS — Z87891 Personal history of nicotine dependence: Secondary | ICD-10-CM | POA: Diagnosis not present

## 2022-02-18 DIAGNOSIS — E1151 Type 2 diabetes mellitus with diabetic peripheral angiopathy without gangrene: Secondary | ICD-10-CM | POA: Diagnosis not present

## 2022-03-02 DIAGNOSIS — Z122 Encounter for screening for malignant neoplasm of respiratory organs: Secondary | ICD-10-CM | POA: Diagnosis not present

## 2022-03-02 DIAGNOSIS — R918 Other nonspecific abnormal finding of lung field: Secondary | ICD-10-CM | POA: Diagnosis not present

## 2022-03-02 DIAGNOSIS — Z87891 Personal history of nicotine dependence: Secondary | ICD-10-CM | POA: Diagnosis not present

## 2022-05-13 DIAGNOSIS — D509 Iron deficiency anemia, unspecified: Secondary | ICD-10-CM | POA: Diagnosis not present

## 2022-05-13 DIAGNOSIS — E1165 Type 2 diabetes mellitus with hyperglycemia: Secondary | ICD-10-CM | POA: Diagnosis not present

## 2022-05-13 DIAGNOSIS — Z23 Encounter for immunization: Secondary | ICD-10-CM | POA: Diagnosis not present

## 2022-05-13 DIAGNOSIS — I2510 Atherosclerotic heart disease of native coronary artery without angina pectoris: Secondary | ICD-10-CM | POA: Diagnosis not present

## 2022-05-13 DIAGNOSIS — N182 Chronic kidney disease, stage 2 (mild): Secondary | ICD-10-CM | POA: Diagnosis not present

## 2022-05-13 DIAGNOSIS — J441 Chronic obstructive pulmonary disease with (acute) exacerbation: Secondary | ICD-10-CM | POA: Diagnosis not present

## 2022-05-13 DIAGNOSIS — E118 Type 2 diabetes mellitus with unspecified complications: Secondary | ICD-10-CM | POA: Diagnosis not present

## 2022-05-13 DIAGNOSIS — R7401 Elevation of levels of liver transaminase levels: Secondary | ICD-10-CM | POA: Diagnosis not present

## 2022-05-13 DIAGNOSIS — I714 Abdominal aortic aneurysm, without rupture, unspecified: Secondary | ICD-10-CM | POA: Diagnosis not present

## 2022-05-13 DIAGNOSIS — I10 Essential (primary) hypertension: Secondary | ICD-10-CM | POA: Diagnosis not present

## 2022-05-13 DIAGNOSIS — R809 Proteinuria, unspecified: Secondary | ICD-10-CM | POA: Diagnosis not present

## 2022-05-13 DIAGNOSIS — K922 Gastrointestinal hemorrhage, unspecified: Secondary | ICD-10-CM | POA: Diagnosis not present

## 2022-05-13 DIAGNOSIS — M109 Gout, unspecified: Secondary | ICD-10-CM | POA: Diagnosis not present

## 2022-05-23 DIAGNOSIS — E118 Type 2 diabetes mellitus with unspecified complications: Secondary | ICD-10-CM | POA: Diagnosis not present

## 2022-05-23 DIAGNOSIS — I2510 Atherosclerotic heart disease of native coronary artery without angina pectoris: Secondary | ICD-10-CM | POA: Diagnosis not present

## 2022-05-23 DIAGNOSIS — K922 Gastrointestinal hemorrhage, unspecified: Secondary | ICD-10-CM | POA: Diagnosis not present

## 2022-06-03 DIAGNOSIS — R918 Other nonspecific abnormal finding of lung field: Secondary | ICD-10-CM | POA: Diagnosis not present

## 2022-06-25 DIAGNOSIS — R809 Proteinuria, unspecified: Secondary | ICD-10-CM | POA: Diagnosis not present

## 2022-06-25 DIAGNOSIS — I10 Essential (primary) hypertension: Secondary | ICD-10-CM | POA: Diagnosis not present

## 2022-06-25 DIAGNOSIS — N182 Chronic kidney disease, stage 2 (mild): Secondary | ICD-10-CM | POA: Diagnosis not present

## 2022-06-25 DIAGNOSIS — I2510 Atherosclerotic heart disease of native coronary artery without angina pectoris: Secondary | ICD-10-CM | POA: Diagnosis not present

## 2022-06-25 DIAGNOSIS — M109 Gout, unspecified: Secondary | ICD-10-CM | POA: Diagnosis not present

## 2022-06-25 DIAGNOSIS — E1122 Type 2 diabetes mellitus with diabetic chronic kidney disease: Secondary | ICD-10-CM | POA: Diagnosis not present

## 2022-06-25 DIAGNOSIS — I714 Abdominal aortic aneurysm, without rupture, unspecified: Secondary | ICD-10-CM | POA: Diagnosis not present

## 2022-06-25 DIAGNOSIS — J441 Chronic obstructive pulmonary disease with (acute) exacerbation: Secondary | ICD-10-CM | POA: Diagnosis not present

## 2022-06-25 DIAGNOSIS — D509 Iron deficiency anemia, unspecified: Secondary | ICD-10-CM | POA: Diagnosis not present

## 2022-06-25 DIAGNOSIS — R7401 Elevation of levels of liver transaminase levels: Secondary | ICD-10-CM | POA: Diagnosis not present

## 2022-06-25 DIAGNOSIS — F17201 Nicotine dependence, unspecified, in remission: Secondary | ICD-10-CM | POA: Diagnosis not present

## 2022-10-04 DIAGNOSIS — K922 Gastrointestinal hemorrhage, unspecified: Secondary | ICD-10-CM | POA: Diagnosis not present

## 2022-10-04 DIAGNOSIS — I2510 Atherosclerotic heart disease of native coronary artery without angina pectoris: Secondary | ICD-10-CM | POA: Diagnosis not present

## 2022-10-04 DIAGNOSIS — E1122 Type 2 diabetes mellitus with diabetic chronic kidney disease: Secondary | ICD-10-CM | POA: Diagnosis not present

## 2022-10-09 DIAGNOSIS — N189 Chronic kidney disease, unspecified: Secondary | ICD-10-CM | POA: Diagnosis not present

## 2022-10-09 DIAGNOSIS — D649 Anemia, unspecified: Secondary | ICD-10-CM | POA: Diagnosis not present

## 2022-10-09 DIAGNOSIS — J441 Chronic obstructive pulmonary disease with (acute) exacerbation: Secondary | ICD-10-CM | POA: Diagnosis not present

## 2022-10-09 DIAGNOSIS — I2510 Atherosclerotic heart disease of native coronary artery without angina pectoris: Secondary | ICD-10-CM | POA: Diagnosis not present

## 2022-10-09 DIAGNOSIS — I10 Essential (primary) hypertension: Secondary | ICD-10-CM | POA: Diagnosis not present

## 2022-10-09 DIAGNOSIS — D509 Iron deficiency anemia, unspecified: Secondary | ICD-10-CM | POA: Diagnosis not present

## 2022-10-09 DIAGNOSIS — M109 Gout, unspecified: Secondary | ICD-10-CM | POA: Diagnosis not present

## 2022-10-09 DIAGNOSIS — R809 Proteinuria, unspecified: Secondary | ICD-10-CM | POA: Diagnosis not present

## 2022-10-09 DIAGNOSIS — R7401 Elevation of levels of liver transaminase levels: Secondary | ICD-10-CM | POA: Diagnosis not present

## 2022-10-09 DIAGNOSIS — E1122 Type 2 diabetes mellitus with diabetic chronic kidney disease: Secondary | ICD-10-CM | POA: Diagnosis not present

## 2022-10-09 DIAGNOSIS — I714 Abdominal aortic aneurysm, without rupture, unspecified: Secondary | ICD-10-CM | POA: Diagnosis not present

## 2023-01-22 DIAGNOSIS — K922 Gastrointestinal hemorrhage, unspecified: Secondary | ICD-10-CM | POA: Diagnosis not present

## 2023-01-22 DIAGNOSIS — E1122 Type 2 diabetes mellitus with diabetic chronic kidney disease: Secondary | ICD-10-CM | POA: Diagnosis not present

## 2023-01-22 DIAGNOSIS — I2510 Atherosclerotic heart disease of native coronary artery without angina pectoris: Secondary | ICD-10-CM | POA: Diagnosis not present

## 2023-01-27 DIAGNOSIS — R809 Proteinuria, unspecified: Secondary | ICD-10-CM | POA: Diagnosis not present

## 2023-01-27 DIAGNOSIS — F17201 Nicotine dependence, unspecified, in remission: Secondary | ICD-10-CM | POA: Diagnosis not present

## 2023-01-27 DIAGNOSIS — I714 Abdominal aortic aneurysm, without rupture, unspecified: Secondary | ICD-10-CM | POA: Diagnosis not present

## 2023-01-27 DIAGNOSIS — E1122 Type 2 diabetes mellitus with diabetic chronic kidney disease: Secondary | ICD-10-CM | POA: Diagnosis not present

## 2023-01-27 DIAGNOSIS — I2510 Atherosclerotic heart disease of native coronary artery without angina pectoris: Secondary | ICD-10-CM | POA: Diagnosis not present

## 2023-01-27 DIAGNOSIS — I10 Essential (primary) hypertension: Secondary | ICD-10-CM | POA: Diagnosis not present

## 2023-01-27 DIAGNOSIS — R7401 Elevation of levels of liver transaminase levels: Secondary | ICD-10-CM | POA: Diagnosis not present

## 2023-01-27 DIAGNOSIS — D509 Iron deficiency anemia, unspecified: Secondary | ICD-10-CM | POA: Diagnosis not present

## 2023-01-27 DIAGNOSIS — N182 Chronic kidney disease, stage 2 (mild): Secondary | ICD-10-CM | POA: Diagnosis not present

## 2023-01-27 DIAGNOSIS — M109 Gout, unspecified: Secondary | ICD-10-CM | POA: Diagnosis not present

## 2023-01-27 DIAGNOSIS — J441 Chronic obstructive pulmonary disease with (acute) exacerbation: Secondary | ICD-10-CM | POA: Diagnosis not present

## 2023-02-27 DIAGNOSIS — E119 Type 2 diabetes mellitus without complications: Secondary | ICD-10-CM | POA: Diagnosis not present

## 2023-02-27 DIAGNOSIS — H53001 Unspecified amblyopia, right eye: Secondary | ICD-10-CM | POA: Diagnosis not present

## 2023-02-27 DIAGNOSIS — Z961 Presence of intraocular lens: Secondary | ICD-10-CM | POA: Diagnosis not present

## 2023-05-22 DIAGNOSIS — D509 Iron deficiency anemia, unspecified: Secondary | ICD-10-CM | POA: Diagnosis not present

## 2023-05-22 DIAGNOSIS — E1122 Type 2 diabetes mellitus with diabetic chronic kidney disease: Secondary | ICD-10-CM | POA: Diagnosis not present

## 2023-05-22 DIAGNOSIS — M109 Gout, unspecified: Secondary | ICD-10-CM | POA: Diagnosis not present

## 2023-05-22 DIAGNOSIS — I2510 Atherosclerotic heart disease of native coronary artery without angina pectoris: Secondary | ICD-10-CM | POA: Diagnosis not present

## 2023-05-29 DIAGNOSIS — R809 Proteinuria, unspecified: Secondary | ICD-10-CM | POA: Diagnosis not present

## 2023-05-29 DIAGNOSIS — R7401 Elevation of levels of liver transaminase levels: Secondary | ICD-10-CM | POA: Diagnosis not present

## 2023-05-29 DIAGNOSIS — I2510 Atherosclerotic heart disease of native coronary artery without angina pectoris: Secondary | ICD-10-CM | POA: Diagnosis not present

## 2023-05-29 DIAGNOSIS — J441 Chronic obstructive pulmonary disease with (acute) exacerbation: Secondary | ICD-10-CM | POA: Diagnosis not present

## 2023-05-29 DIAGNOSIS — I10 Essential (primary) hypertension: Secondary | ICD-10-CM | POA: Diagnosis not present

## 2023-05-29 DIAGNOSIS — E1122 Type 2 diabetes mellitus with diabetic chronic kidney disease: Secondary | ICD-10-CM | POA: Diagnosis not present

## 2023-05-29 DIAGNOSIS — M109 Gout, unspecified: Secondary | ICD-10-CM | POA: Diagnosis not present

## 2023-05-29 DIAGNOSIS — I714 Abdominal aortic aneurysm, without rupture, unspecified: Secondary | ICD-10-CM | POA: Diagnosis not present

## 2023-05-29 DIAGNOSIS — N182 Chronic kidney disease, stage 2 (mild): Secondary | ICD-10-CM | POA: Diagnosis not present

## 2023-05-29 DIAGNOSIS — D509 Iron deficiency anemia, unspecified: Secondary | ICD-10-CM | POA: Diagnosis not present

## 2023-05-29 DIAGNOSIS — F17210 Nicotine dependence, cigarettes, uncomplicated: Secondary | ICD-10-CM | POA: Diagnosis not present

## 2023-06-02 ENCOUNTER — Telehealth: Payer: Self-pay

## 2023-06-02 ENCOUNTER — Other Ambulatory Visit: Payer: Self-pay

## 2023-06-02 NOTE — Telephone Encounter (Signed)
Auth Submission: NO AUTH NEEDED Site of care: Site of care: [facility] Payer: [employer] MEDICARE Medication & CPT/J Code(s) submitted: Feraheme (ferumoxytol) [id number] Route of submission (phone, fax, portal): portal Phone # Fax # Auth type: Buy/Bill PB Units/visits requested: 510mg , 2 doses Reference number: [id number] Approval from: 06/02/23 to 07/29/23

## 2023-06-09 ENCOUNTER — Encounter: Payer: Medicare Other | Attending: Internal Medicine | Admitting: Internal Medicine

## 2023-06-09 VITALS — BP 144/54 | HR 70 | Temp 98.0°F | Resp 16

## 2023-06-09 DIAGNOSIS — D62 Acute posthemorrhagic anemia: Secondary | ICD-10-CM

## 2023-06-09 DIAGNOSIS — D649 Anemia, unspecified: Secondary | ICD-10-CM

## 2023-06-09 MED ORDER — DIPHENHYDRAMINE HCL 25 MG PO CAPS
25.0000 mg | ORAL_CAPSULE | Freq: Once | ORAL | Status: AC
  Administered 2023-06-09: 25 mg via ORAL

## 2023-06-09 MED ORDER — ACETAMINOPHEN 325 MG PO TABS
650.0000 mg | ORAL_TABLET | Freq: Once | ORAL | Status: AC
  Administered 2023-06-09: 650 mg via ORAL

## 2023-06-09 MED ORDER — SODIUM CHLORIDE 0.9 % IV SOLN
510.0000 mg | Freq: Once | INTRAVENOUS | Status: AC
  Administered 2023-06-09: 510 mg via INTRAVENOUS
  Filled 2023-06-09: qty 17

## 2023-06-09 NOTE — Progress Notes (Signed)
Diagnosis: Iron Deficiency Anemia  Provider:   Benita Stabile  Procedure: IV Infusion  IV Type: Peripheral, IV Location: R Antecubital  Feraheme, Dose: 510 mg  Infusion Start Time: 1419  Infusion Stop Time: 1438  Post Infusion IV Care: Observation period completed  Discharge: Condition: Good, Destination: Home . AVS Provided  Performed by:  Feliberto Harts, LPN

## 2023-06-16 ENCOUNTER — Encounter: Payer: Medicare Other | Admitting: Emergency Medicine

## 2023-06-16 VITALS — BP 122/56 | HR 71 | Temp 98.6°F | Resp 16

## 2023-06-16 DIAGNOSIS — D62 Acute posthemorrhagic anemia: Secondary | ICD-10-CM

## 2023-06-16 DIAGNOSIS — D649 Anemia, unspecified: Secondary | ICD-10-CM

## 2023-06-16 MED ORDER — ACETAMINOPHEN 325 MG PO TABS
650.0000 mg | ORAL_TABLET | Freq: Once | ORAL | Status: AC
  Administered 2023-06-16: 650 mg via ORAL
  Filled 2023-06-16: qty 2

## 2023-06-16 MED ORDER — FERUMOXYTOL INJECTION 510 MG/17 ML
510.0000 mg | Freq: Once | INTRAVENOUS | Status: AC
  Administered 2023-06-16: 510 mg via INTRAVENOUS
  Filled 2023-06-16: qty 17

## 2023-06-16 MED ORDER — DIPHENHYDRAMINE HCL 25 MG PO CAPS
25.0000 mg | ORAL_CAPSULE | Freq: Once | ORAL | Status: AC
  Administered 2023-06-16: 25 mg via ORAL
  Filled 2023-06-16: qty 1

## 2023-06-16 NOTE — Progress Notes (Signed)
Diagnosis: Iron Deficiency Anemia  Provider:  Dwana Melena MD  Procedure: IV Infusion  IV Type: Peripheral, IV Location: R Antecubital  Feraheme (Ferumoxytol), Dose: 510 mg  Infusion Start Time: 1418  Infusion Stop Time: 1434  Post Infusion IV Care: Patient declined observation, peripheral IV discontinued  Discharge: Condition: Good, Destination: Home . AVS Provided  Performed by:  Arrie Senate, RN

## 2023-07-11 DIAGNOSIS — K922 Gastrointestinal hemorrhage, unspecified: Secondary | ICD-10-CM | POA: Diagnosis not present

## 2023-07-11 DIAGNOSIS — Z0000 Encounter for general adult medical examination without abnormal findings: Secondary | ICD-10-CM | POA: Diagnosis not present

## 2023-07-11 DIAGNOSIS — E1122 Type 2 diabetes mellitus with diabetic chronic kidney disease: Secondary | ICD-10-CM | POA: Diagnosis not present

## 2023-07-11 DIAGNOSIS — R296 Repeated falls: Secondary | ICD-10-CM | POA: Diagnosis not present

## 2023-07-11 DIAGNOSIS — D509 Iron deficiency anemia, unspecified: Secondary | ICD-10-CM | POA: Diagnosis not present

## 2023-07-11 DIAGNOSIS — I10 Essential (primary) hypertension: Secondary | ICD-10-CM | POA: Diagnosis not present

## 2023-09-26 ENCOUNTER — Ambulatory Visit
Admission: EM | Admit: 2023-09-26 | Discharge: 2023-09-26 | Disposition: A | Discharge status: Home / Self Care | Payer: Medicare Other

## 2023-09-26 ENCOUNTER — Encounter: Payer: Self-pay | Admitting: Emergency Medicine

## 2023-09-26 ENCOUNTER — Other Ambulatory Visit: Payer: Self-pay

## 2023-09-26 ENCOUNTER — Telehealth: Payer: Self-pay | Admitting: *Deleted

## 2023-09-26 DIAGNOSIS — Z87891 Personal history of nicotine dependence: Secondary | ICD-10-CM | POA: Diagnosis not present

## 2023-09-26 DIAGNOSIS — J209 Acute bronchitis, unspecified: Secondary | ICD-10-CM

## 2023-09-26 HISTORY — DX: Anxiety disorder, unspecified: F41.9

## 2023-09-26 LAB — POC COVID19/FLU A&B COMBO
Covid Antigen, POC: NEGATIVE
Influenza A Antigen, POC: NEGATIVE
Influenza B Antigen, POC: NEGATIVE

## 2023-09-26 MED ORDER — PREDNISONE 20 MG PO TABS: 20.0000 mg | ORAL_TABLET | Freq: Every day | ORAL | 0 refills | Status: AC

## 2023-09-26 MED ORDER — AZITHROMYCIN 250 MG PO TABS: 250.0000 mg | ORAL_TABLET | Freq: Every day | ORAL | 0 refills | Status: DC

## 2023-09-26 MED ORDER — BENZONATATE 100 MG PO CAPS: 100.0000 mg | ORAL_CAPSULE | Freq: Three times a day (TID) | ORAL | 0 refills | Status: AC

## 2023-09-26 MED ORDER — BENZONATATE 100 MG PO CAPS: 100.0000 mg | ORAL_CAPSULE | Freq: Three times a day (TID) | ORAL | 0 refills | Status: DC

## 2023-09-26 NOTE — ED Triage Notes (Addendum)
 Pt reports cough, nasal congestion, runny nose since Saturday. Denies any known fevers.   Pt noted to have coughing episode that lasted several minutes. Pt used inhaler x2. Coughing has improved but respirations have increased.

## 2023-09-26 NOTE — Discharge Instructions (Addendum)
 COVID/flu test was negative. Take medication as prescribed.  Monitor your blood glucose levels while you are taking the prednisone.  If blood glucose exceeds 450, stop the medication, and go to the emergency department immediately. Increase fluids and allow for plenty of rest. May take over-the-counter Tylenol as needed for pain, fever, or general discomfort. May use normal saline nasal spray throughout the day for nasal congestion and runny nose. For your cough, recommend sleeping elevated on pillows and using a humidifier in your bedroom while symptoms persist. Please be advised that your cough may last for several days to weeks.  If you are generally feeling well, but continued to have a persistent nagging cough, continue over-the-counter cough and cold medications, and use of cough drops.  If you develop new symptoms such as fevers, chills, wheezing, or shortness of breath, please follow-up with your primary care physician for further evaluation. Follow-up as needed.

## 2023-09-26 NOTE — ED Provider Notes (Signed)
 [facility]    CSN: [id number] Arrival date & time: 09/26/23  1129      History   Chief Complaint Chief Complaint  Patient presents with   Cough    HPI Alicia Ortega is a 73 y.o. female.   The history is provided by the patient.   Patient presents for complaints of persistent cough, nasal congestion, and runny nose that is been present for the past 6 days.  Denies fever, chills, sore throat, wheezing, difficulty breathing, chest pain, abdominal pain, nausea, vomiting, diarrhea, or rash.  Patient reports that she has been taking over-the-counter cough and cold medications for her symptoms, patient is also using an albuterol inhaler as needed.  Patient with underlying history of type 2 diabetes, states her last A1c was between 6 and 7.  Past Medical History:  Diagnosis Date   AAA (abdominal aortic aneurysm) (HCC)    Anxiety    Coronary artery disease    Depression    Diabetes mellitus without complication (HCC)    HLD (hyperlipidemia)    Peripheral arterial disease (HCC)    Wears dentures    full upper and lower    Patient Active Problem List   Diagnosis Date Noted   Acute blood loss anemia 05/18/2021   Symptomatic anemia 05/18/2021   CAD (coronary artery disease) 05/18/2021   HTN (hypertension) 05/18/2021   HLD (hyperlipidemia) 05/18/2021   Depression 05/18/2021   DM (diabetes mellitus), type 2 (HCC) 05/18/2021    Past Surgical History:  Procedure Laterality Date   ABDOMINAL AORTIC ANEURYSM REPAIR  10/2020   CATARACT EXTRACTION W/PHACO Right 01/07/2022   Procedure: CATARACT EXTRACTION PHACO AND INTRAOCULAR LENS PLACEMENT (IOC) RIGHT;  Surgeon: Nevada Crane, MD;  Location: [facility];  Service: Ophthalmology;  Laterality: Right;  2.86 0:21.9   CATARACT EXTRACTION W/PHACO Left 01/21/2022   Procedure: CATARACT EXTRACTION PHACO AND INTRAOCULAR LENS PLACEMENT (IOC) LEFT DIABETIC;  Surgeon: Nevada Crane, MD;  Location: [facility];  Service: Ophthalmology;  Laterality: Left;  Diabetic 2.11 00:18.4   CESAREAN SECTION     1977 and 1979   CHOLECYSTECTOMY     COLONOSCOPY N/A 03/06/2016   Procedure: COLONOSCOPY;  Surgeon: Malissa Hippo, MD;  Location: [facility];  Service: Endoscopy;  Laterality: N/A;  930   CORONARY ANGIOPLASTY WITH STENT PLACEMENT     stents placed   CORONARY ARTERY BYPASS GRAFT  2005   [facility]  3 vessel   FEMUR SURGERY Left 05/03/2021    OB History   No obstetric history on file.      Home Medications    Prior to Admission medications   Medication Sig Start Date End Date Taking? Authorizing Provider  lisinopril (ZESTRIL) 2.5 MG tablet Take 2.5 mg by mouth daily.   Yes [provider]  LORazepam (ATIVAN) 0.5 MG tablet Take 0.5 mg by mouth every 8 (eight) hours.   Yes [provider]  predniSONE (DELTASONE) 20 MG tablet Take 1 tablet (20 mg total) by mouth daily with breakfast for 7 days. 09/26/23 10/03/23 Yes Leath-Warren, Sadie Haber, NP  albuterol (PROVENTIL HFA;VENTOLIN HFA) 108 (90 Base) MCG/ACT inhaler Inhale 1-2 puffs into the lungs every 6 (six) hours as needed for wheezing or shortness of breath.    [provider]  amLODipine (NORVASC) 10 MG tablet Take 10 mg by mouth daily.    [provider]  azithromycin (ZITHROMAX) 250 MG tablet Take 1 tablet (250 mg total) by mouth daily. Take  first 2 tablets together, then 1 every day until finished. 09/26/23   Leath-Warren, Sadie Haber, NP  benzonatate (TESSALON) 100 MG capsule Take 1 capsule (100 mg total) by mouth every 8 (eight) hours. 09/26/23   Leath-Warren, Sadie Haber, NP  buPROPion (WELLBUTRIN XL) 150 MG 24 hr tablet Take 150 mg by mouth daily.    [provider]  clopidogrel (PLAVIX) 75 MG tablet Take 75 mg by mouth daily.    [provider]  colchicine 0.6 MG tablet Take 0.6 mg by mouth daily as needed (flare).    [provider]  diazepam (VALIUM) 2 MG tablet Take 2 mg by  mouth every 6 (six) hours as needed for anxiety.    [provider]  DULoxetine (CYMBALTA) 30 MG capsule Take 30 mg by mouth daily.    [provider]  fenofibrate micronized (LOFIBRA) 134 MG capsule Take 134 mg by mouth daily before breakfast.    [provider]  gabapentin (NEURONTIN) 300 MG capsule Take 300 mg by mouth 3 (three) times daily. Patient not taking: Reported on 12/31/2021    [provider]  levocetirizine (XYZAL) 5 MG tablet Take 5 mg by mouth at bedtime as needed for allergies.    [provider]  loratadine (CLARITIN) 10 MG tablet Take 10 mg by mouth daily.    [provider]  metFORMIN (GLUCOPHAGE) 1000 MG tablet Take 1,000 mg by mouth 2 (two) times daily.    [provider]  metoprolol tartrate (LOPRESSOR) 50 MG tablet Take 25 mg by mouth 2 (two) times daily.    [provider]  Multiple Vitamin (MULTIVITAMIN WITH MINERALS) TABS tablet Take 1 tablet by mouth daily.    [provider]  oxyCODONE (OXY IR/ROXICODONE) 5 MG immediate release tablet Take 5 mg by mouth every 4 (four) hours as needed for severe pain.    [provider]  pantoprazole (PROTONIX) 40 MG tablet Take 1 tablet (40 mg total) by mouth 2 (two) times daily. 05/19/21   Catarina Hartshorn, MD  rosuvastatin (CRESTOR) 20 MG tablet Take 20 mg by mouth daily.    [provider]  Vitamin D3 (VITAMIN D) 25 MCG tablet Take 1,000 Units by mouth daily.    [provider]  zolpidem (AMBIEN) 10 MG tablet Take 10 mg by mouth at bedtime.    [provider]    Family History Family History  Problem Relation Age of Onset   Parkinson's disease Brother     Social History Social History   Tobacco Use   Smoking status: Former    Current packs/day: 0.00    Types: Cigarettes    Quit date: 10/2020    Years since quitting: 2.9   Smokeless tobacco: Never  Vaping Use   Vaping status: Never Used  Substance Use Topics    Alcohol use: Yes    Comment: rarely   Drug use: No     Allergies   Codeine, Hydrocodone, and Lipitor [atorvastatin]   Review of Systems Review of Systems Per HPI  Physical Exam Triage Vital Signs ED Triage Vitals  Encounter Vitals Group     BP 09/26/23 1215 (!) 202/77     Systolic BP Percentile --      Diastolic BP Percentile --      Pulse Rate 09/26/23 1215 (!) 101     Resp 09/26/23 1215 (!) 24     Temp --      Temp Source 09/26/23 1215 Temporal  SpO2 09/26/23 1215 92 %     Weight --      Height --      Head Circumference --      Peak Flow --      Pain Score 09/26/23 1211 0     Pain Loc --      Pain Education --      Exclude from Growth Chart --    No data found.  Updated Vital Signs BP (!) 148/69 (BP Location: Right Arm)   Pulse 77   Resp 20   SpO2 94%   Visual Acuity Right Eye Distance:   Left Eye Distance:   Bilateral Distance:    Right Eye Near:   Left Eye Near:    Bilateral Near:     Physical Exam Vitals and nursing note reviewed.  Constitutional:      General: She is not in acute distress.    Appearance: Normal appearance.  HENT:     Head: Normocephalic.     Right Ear: Tympanic membrane, ear canal and external ear normal.     Left Ear: Tympanic membrane, ear canal and external ear normal.     Nose: Rhinorrhea present.     Right Turbinates: Enlarged and swollen.     Left Turbinates: Enlarged and swollen.     Right Sinus: No maxillary sinus tenderness or frontal sinus tenderness.     Left Sinus: No maxillary sinus tenderness or frontal sinus tenderness.     Mouth/Throat:     Lips: Pink.     Mouth: Mucous membranes are moist.     Pharynx: Posterior oropharyngeal erythema and postnasal drip present. No pharyngeal swelling, oropharyngeal exudate or uvula swelling.     Comments: Cobblestoning present to posterior oropharynx  Eyes:     Extraocular Movements: Extraocular movements intact.     Conjunctiva/sclera: Conjunctivae normal.      Pupils: Pupils are equal, round, and reactive to light.  Cardiovascular:     Rate and Rhythm: Normal rate and regular rhythm.     Pulses: Normal pulses.     Heart sounds: Normal heart sounds.  Pulmonary:     Effort: Pulmonary effort is normal. No respiratory distress.     Breath sounds: Normal breath sounds. No stridor. No wheezing, rhonchi or rales.  Abdominal:     General: Bowel sounds are normal.     Palpations: Abdomen is soft.     Tenderness: There is no abdominal tenderness.  Musculoskeletal:     Cervical back: Normal range of motion.  Lymphadenopathy:     Cervical: No cervical adenopathy.  Skin:    General: Skin is warm and dry.  Neurological:     General: No focal deficit present.     Mental Status: She is alert and oriented to person, place, and time.  Psychiatric:        Mood and Affect: Mood normal.        Behavior: Behavior normal.      UC Treatments / Results  Labs (all labs ordered are listed, but only abnormal results are displayed) Labs Reviewed  POC COVID19/FLU A&B COMBO    EKG   Radiology No results found.  Procedures Procedures (including critical care time)  Medications Ordered in UC Medications - No data to display  Initial Impression / Assessment and Plan / UC Course  I have reviewed the triage vital signs and the nursing notes.  Pertinent labs & imaging results that were available during my care of the patient were  reviewed by me and considered in my medical decision making (see chart for details).  COVID/flu test is negative.  Symptoms consistent with acute bronchitis given the persistency of the patient's cough.  Will start patient on prednisone 20 mg daily for the next 7 days, Tessalon Perles 100 mg for cough, and azithromycin 250 mg to cover for lower respiratory infection.  Supportive care recommendations were provided and discussed with the patient to include fluids, rest, use of a humidifier during sleep, and sleeping elevated while  symptoms persist.  Patient was also given parameters of when to seek treatment regarding her blood glucose while taking the prednisone.  Patient was given indications regarding follow-up.  Patient was in agreement with this plan of care and verbalized understanding.  All questions were answered.  Patient stable for discharge.  Final Clinical Impressions(s) / UC Diagnoses   Final diagnoses:  Acute bronchitis, unspecified organism  Former smoker     Discharge Instructions      COVID/flu test was negative. Take medication as prescribed.  Monitor your blood glucose levels while you are taking the prednisone.  If blood glucose exceeds 450, stop the medication, and go to the emergency department immediately. Increase fluids and allow for plenty of rest. May take over-the-counter Tylenol as needed for pain, fever, or general discomfort. May use normal saline nasal spray throughout the day for nasal congestion and runny nose. For your cough, recommend sleeping elevated on pillows and using a humidifier in your bedroom while symptoms persist. Please be advised that your cough may last for several days to weeks.  If you are generally feeling well, but continued to have a persistent nagging cough, continue over-the-counter cough and cold medications, and use of cough drops.  If you develop new symptoms such as fevers, chills, wheezing, or shortness of breath, please follow-up with your primary care physician for further evaluation. Follow-up as needed.     ED Prescriptions     Medication Sig Dispense Auth. Provider   predniSONE (DELTASONE) 20 MG tablet Take 1 tablet (20 mg total) by mouth daily with breakfast for 7 days. 7 tablet Leath-Warren, Sadie Haber, NP   benzonatate (TESSALON) 100 MG capsule Take 1 capsule (100 mg total) by mouth every 8 (eight) hours. 30 capsule Leath-Warren, Sadie Haber, NP   azithromycin (ZITHROMAX) 250 MG tablet Take 1 tablet (250 mg total) by mouth daily. Take first 2  tablets together, then 1 every day until finished. 6 tablet Leath-Warren, Sadie Haber, NP      PDMP not reviewed this encounter.   Abran Cantor, NP 09/26/23 1711

## 2023-10-22 DIAGNOSIS — I2510 Atherosclerotic heart disease of native coronary artery without angina pectoris: Secondary | ICD-10-CM | POA: Diagnosis not present

## 2023-10-22 DIAGNOSIS — E1122 Type 2 diabetes mellitus with diabetic chronic kidney disease: Secondary | ICD-10-CM | POA: Diagnosis not present

## 2023-10-22 DIAGNOSIS — D509 Iron deficiency anemia, unspecified: Secondary | ICD-10-CM | POA: Diagnosis not present

## 2023-10-22 DIAGNOSIS — M109 Gout, unspecified: Secondary | ICD-10-CM | POA: Diagnosis not present

## 2023-10-29 DIAGNOSIS — E782 Mixed hyperlipidemia: Secondary | ICD-10-CM | POA: Diagnosis not present

## 2023-10-29 DIAGNOSIS — R809 Proteinuria, unspecified: Secondary | ICD-10-CM | POA: Diagnosis not present

## 2023-10-29 DIAGNOSIS — R296 Repeated falls: Secondary | ICD-10-CM | POA: Diagnosis not present

## 2023-10-29 DIAGNOSIS — I10 Essential (primary) hypertension: Secondary | ICD-10-CM | POA: Diagnosis not present

## 2023-10-29 DIAGNOSIS — I714 Abdominal aortic aneurysm, without rupture, unspecified: Secondary | ICD-10-CM | POA: Diagnosis not present

## 2023-10-29 DIAGNOSIS — K922 Gastrointestinal hemorrhage, unspecified: Secondary | ICD-10-CM | POA: Diagnosis not present

## 2023-10-29 DIAGNOSIS — I2510 Atherosclerotic heart disease of native coronary artery without angina pectoris: Secondary | ICD-10-CM | POA: Diagnosis not present

## 2023-10-29 DIAGNOSIS — E1122 Type 2 diabetes mellitus with diabetic chronic kidney disease: Secondary | ICD-10-CM | POA: Diagnosis not present

## 2023-10-29 DIAGNOSIS — R7401 Elevation of levels of liver transaminase levels: Secondary | ICD-10-CM | POA: Diagnosis not present

## 2023-10-29 DIAGNOSIS — N182 Chronic kidney disease, stage 2 (mild): Secondary | ICD-10-CM | POA: Diagnosis not present

## 2023-10-29 DIAGNOSIS — D509 Iron deficiency anemia, unspecified: Secondary | ICD-10-CM | POA: Diagnosis not present

## 2024-02-12 DIAGNOSIS — M109 Gout, unspecified: Secondary | ICD-10-CM | POA: Diagnosis not present

## 2024-02-12 DIAGNOSIS — D509 Iron deficiency anemia, unspecified: Secondary | ICD-10-CM | POA: Diagnosis not present

## 2024-02-12 DIAGNOSIS — E1122 Type 2 diabetes mellitus with diabetic chronic kidney disease: Secondary | ICD-10-CM | POA: Diagnosis not present

## 2024-02-12 DIAGNOSIS — I2510 Atherosclerotic heart disease of native coronary artery without angina pectoris: Secondary | ICD-10-CM | POA: Diagnosis not present

## 2024-02-17 DIAGNOSIS — E782 Mixed hyperlipidemia: Secondary | ICD-10-CM | POA: Diagnosis not present

## 2024-02-17 DIAGNOSIS — M109 Gout, unspecified: Secondary | ICD-10-CM | POA: Diagnosis not present

## 2024-02-17 DIAGNOSIS — E1122 Type 2 diabetes mellitus with diabetic chronic kidney disease: Secondary | ICD-10-CM | POA: Diagnosis not present

## 2024-02-17 DIAGNOSIS — N182 Chronic kidney disease, stage 2 (mild): Secondary | ICD-10-CM | POA: Diagnosis not present

## 2024-02-17 DIAGNOSIS — I129 Hypertensive chronic kidney disease with stage 1 through stage 4 chronic kidney disease, or unspecified chronic kidney disease: Secondary | ICD-10-CM | POA: Diagnosis not present

## 2024-02-17 DIAGNOSIS — K922 Gastrointestinal hemorrhage, unspecified: Secondary | ICD-10-CM | POA: Diagnosis not present

## 2024-02-17 DIAGNOSIS — I10 Essential (primary) hypertension: Secondary | ICD-10-CM | POA: Diagnosis not present

## 2024-02-17 DIAGNOSIS — D509 Iron deficiency anemia, unspecified: Secondary | ICD-10-CM | POA: Diagnosis not present

## 2024-02-17 DIAGNOSIS — I714 Abdominal aortic aneurysm, without rupture, unspecified: Secondary | ICD-10-CM | POA: Diagnosis not present

## 2024-02-17 DIAGNOSIS — R809 Proteinuria, unspecified: Secondary | ICD-10-CM | POA: Diagnosis not present

## 2024-02-17 DIAGNOSIS — I2510 Atherosclerotic heart disease of native coronary artery without angina pectoris: Secondary | ICD-10-CM | POA: Diagnosis not present

## 2024-03-04 DIAGNOSIS — Z961 Presence of intraocular lens: Secondary | ICD-10-CM | POA: Diagnosis not present

## 2024-03-04 DIAGNOSIS — E119 Type 2 diabetes mellitus without complications: Secondary | ICD-10-CM | POA: Diagnosis not present

## 2024-05-10 DIAGNOSIS — S91332A Puncture wound without foreign body, left foot, initial encounter: Secondary | ICD-10-CM | POA: Diagnosis not present

## 2024-05-10 DIAGNOSIS — Z23 Encounter for immunization: Secondary | ICD-10-CM | POA: Diagnosis not present

## 2024-08-04 ENCOUNTER — Inpatient Hospital Stay
Admission: EM | Admit: 2024-08-04 | Discharge: 2024-08-06 | DRG: 291 | Disposition: A | Discharge status: Home / Self Care

## 2024-08-04 ENCOUNTER — Emergency Department

## 2024-08-04 ENCOUNTER — Other Ambulatory Visit: Payer: Self-pay

## 2024-08-04 ENCOUNTER — Encounter: Payer: Self-pay | Admitting: Emergency Medicine

## 2024-08-04 DIAGNOSIS — I447 Left bundle-branch block, unspecified: Secondary | ICD-10-CM | POA: Diagnosis present

## 2024-08-04 DIAGNOSIS — E119 Type 2 diabetes mellitus without complications: Secondary | ICD-10-CM | POA: Diagnosis not present

## 2024-08-04 DIAGNOSIS — I2510 Atherosclerotic heart disease of native coronary artery without angina pectoris: Secondary | ICD-10-CM | POA: Diagnosis present

## 2024-08-04 DIAGNOSIS — F32A Depression, unspecified: Secondary | ICD-10-CM | POA: Diagnosis present

## 2024-08-04 DIAGNOSIS — F419 Anxiety disorder, unspecified: Secondary | ICD-10-CM | POA: Diagnosis present

## 2024-08-04 DIAGNOSIS — Z7984 Long term (current) use of oral hypoglycemic drugs: Secondary | ICD-10-CM | POA: Diagnosis not present

## 2024-08-04 DIAGNOSIS — I252 Old myocardial infarction: Secondary | ICD-10-CM

## 2024-08-04 DIAGNOSIS — D509 Iron deficiency anemia, unspecified: Secondary | ICD-10-CM | POA: Diagnosis present

## 2024-08-04 DIAGNOSIS — E876 Hypokalemia: Secondary | ICD-10-CM | POA: Diagnosis present

## 2024-08-04 DIAGNOSIS — I2489 Other forms of acute ischemic heart disease: Secondary | ICD-10-CM | POA: Diagnosis present

## 2024-08-04 DIAGNOSIS — F17210 Nicotine dependence, cigarettes, uncomplicated: Secondary | ICD-10-CM | POA: Diagnosis present

## 2024-08-04 DIAGNOSIS — J42 Unspecified chronic bronchitis: Secondary | ICD-10-CM

## 2024-08-04 DIAGNOSIS — I25118 Atherosclerotic heart disease of native coronary artery with other forms of angina pectoris: Secondary | ICD-10-CM | POA: Diagnosis not present

## 2024-08-04 DIAGNOSIS — I10 Essential (primary) hypertension: Secondary | ICD-10-CM | POA: Diagnosis not present

## 2024-08-04 DIAGNOSIS — Z7902 Long term (current) use of antithrombotics/antiplatelets: Secondary | ICD-10-CM

## 2024-08-04 DIAGNOSIS — R9431 Abnormal electrocardiogram [ECG] [EKG]: Secondary | ICD-10-CM | POA: Diagnosis present

## 2024-08-04 DIAGNOSIS — J9620 Acute and chronic respiratory failure, unspecified whether with hypoxia or hypercapnia: Secondary | ICD-10-CM | POA: Diagnosis not present

## 2024-08-04 DIAGNOSIS — Z79899 Other long term (current) drug therapy: Secondary | ICD-10-CM | POA: Diagnosis not present

## 2024-08-04 DIAGNOSIS — D649 Anemia, unspecified: Secondary | ICD-10-CM | POA: Diagnosis not present

## 2024-08-04 DIAGNOSIS — I110 Hypertensive heart disease with heart failure: Principal | ICD-10-CM | POA: Diagnosis present

## 2024-08-04 DIAGNOSIS — I3481 Nonrheumatic mitral (valve) annulus calcification: Secondary | ICD-10-CM | POA: Diagnosis present

## 2024-08-04 DIAGNOSIS — Z951 Presence of aortocoronary bypass graft: Secondary | ICD-10-CM

## 2024-08-04 DIAGNOSIS — I5023 Acute on chronic systolic (congestive) heart failure: Secondary | ICD-10-CM | POA: Diagnosis present

## 2024-08-04 DIAGNOSIS — I5A Non-ischemic myocardial injury (non-traumatic): Secondary | ICD-10-CM | POA: Diagnosis present

## 2024-08-04 DIAGNOSIS — E1151 Type 2 diabetes mellitus with diabetic peripheral angiopathy without gangrene: Secondary | ICD-10-CM | POA: Diagnosis present

## 2024-08-04 DIAGNOSIS — F17200 Nicotine dependence, unspecified, uncomplicated: Secondary | ICD-10-CM | POA: Diagnosis present

## 2024-08-04 DIAGNOSIS — J9601 Acute respiratory failure with hypoxia: Secondary | ICD-10-CM | POA: Diagnosis not present

## 2024-08-04 DIAGNOSIS — J810 Acute pulmonary edema: Secondary | ICD-10-CM | POA: Diagnosis present

## 2024-08-04 DIAGNOSIS — Z794 Long term (current) use of insulin: Secondary | ICD-10-CM

## 2024-08-04 DIAGNOSIS — F418 Other specified anxiety disorders: Secondary | ICD-10-CM | POA: Diagnosis not present

## 2024-08-04 DIAGNOSIS — I480 Paroxysmal atrial fibrillation: Secondary | ICD-10-CM | POA: Diagnosis present

## 2024-08-04 DIAGNOSIS — Z952 Presence of prosthetic heart valve: Secondary | ICD-10-CM

## 2024-08-04 DIAGNOSIS — Z820 Family history of epilepsy and other diseases of the nervous system: Secondary | ICD-10-CM

## 2024-08-04 DIAGNOSIS — E785 Hyperlipidemia, unspecified: Secondary | ICD-10-CM | POA: Diagnosis present

## 2024-08-04 DIAGNOSIS — J449 Chronic obstructive pulmonary disease, unspecified: Secondary | ICD-10-CM | POA: Diagnosis present

## 2024-08-04 DIAGNOSIS — I255 Ischemic cardiomyopathy: Secondary | ICD-10-CM | POA: Diagnosis present

## 2024-08-04 DIAGNOSIS — Z885 Allergy status to narcotic agent status: Secondary | ICD-10-CM

## 2024-08-04 DIAGNOSIS — Z955 Presence of coronary angioplasty implant and graft: Secondary | ICD-10-CM

## 2024-08-04 HISTORY — DX: Paroxysmal atrial fibrillation: I48.0

## 2024-08-04 HISTORY — DX: Chronic systolic (congestive) heart failure: I50.22

## 2024-08-04 HISTORY — DX: Chronic obstructive pulmonary disease, unspecified: J44.9

## 2024-08-04 LAB — PROTIME-INR
INR: 1.2 (ref 0.8–1.2)
Prothrombin Time: 15.7 s — ABNORMAL HIGH (ref 11.4–15.2)

## 2024-08-04 LAB — BASIC METABOLIC PANEL WITH GFR
Anion gap: 15 (ref 5–15)
BUN: 17 mg/dL (ref 8–23)
CO2: 20 mmol/L — ABNORMAL LOW (ref 22–32)
Calcium: 9.2 mg/dL (ref 8.9–10.3)
Chloride: 103 mmol/L (ref 98–111)
Creatinine, Ser: 0.9 mg/dL (ref 0.44–1.00)
GFR, Estimated: >60 mL/min
Glucose, Bld: 417 mg/dL — ABNORMAL HIGH (ref 70–99)
Potassium: 3.4 mmol/L — ABNORMAL LOW (ref 3.5–5.1)
Sodium: 138 mmol/L (ref 135–145)

## 2024-08-04 LAB — CBC
HCT: 33.8 % — ABNORMAL LOW (ref 36.0–46.0)
Hemoglobin: 11 g/dL — ABNORMAL LOW (ref 12.0–15.0)
MCH: 28 pg (ref 26.0–34.0)
MCHC: 32.5 g/dL (ref 30.0–36.0)
MCV: 86 fL (ref 80.0–100.0)
Platelets: 177 K/uL (ref 150–400)
RBC: 3.93 MIL/uL (ref 3.87–5.11)
RDW: 14 % (ref 11.5–15.5)
WBC: 7.4 K/uL (ref 4.0–10.5)
nRBC: 0 % (ref 0.0–0.2)

## 2024-08-04 LAB — APTT: aPTT: 27 s (ref 24–36)

## 2024-08-04 LAB — PRO BRAIN NATRIURETIC PEPTIDE: Pro Brain Natriuretic Peptide: 3752 pg/mL — ABNORMAL HIGH

## 2024-08-04 LAB — TROPONIN T, HIGH SENSITIVITY
Troponin T High Sensitivity: 28 ng/L — ABNORMAL HIGH (ref 0–19)
Troponin T High Sensitivity: 50 ng/L — ABNORMAL HIGH (ref 0–19)

## 2024-08-04 LAB — CBG MONITORING, ED: Glucose-Capillary: 305 mg/dL — ABNORMAL HIGH (ref 70–99)

## 2024-08-04 LAB — PHOSPHORUS: Phosphorus: 3.5 mg/dL (ref 2.5–4.6)

## 2024-08-04 LAB — MAGNESIUM: Magnesium: 1.7 mg/dL (ref 1.7–2.4)

## 2024-08-04 MED ORDER — DM-GUAIFENESIN ER 30-600 MG PO TB12
1.0000 | ORAL_TABLET | Freq: Two times a day (BID) | ORAL | Status: DC | PRN
  Filled 2024-08-04: qty 1

## 2024-08-04 MED ORDER — DIPHENHYDRAMINE HCL 50 MG/ML IJ SOLN: 12.5000 mg | Freq: Three times a day (TID) | INTRAMUSCULAR | Status: DC | PRN

## 2024-08-04 MED ORDER — VITAMIN D 25 MCG (1000 UNIT) PO TABS
1000.0000 [IU] | ORAL_TABLET | Freq: Every day | ORAL | Status: DC
  Administered 2024-08-05 – 2024-08-06 (×2): 1000 [IU] via ORAL
  Filled 2024-08-04 (×2): qty 1

## 2024-08-04 MED ORDER — INSULIN ASPART 100 UNIT/ML IJ SOLN
0.0000 [IU] | Freq: Every day | INTRAMUSCULAR | Status: DC
  Administered 2024-08-04: 4 [IU] via SUBCUTANEOUS
  Administered 2024-08-05: 2 [IU] via SUBCUTANEOUS
  Filled 2024-08-04: qty 2
  Filled 2024-08-04: qty 4

## 2024-08-04 MED ORDER — ADULT MULTIVITAMIN W/MINERALS CH
1.0000 | ORAL_TABLET | Freq: Every day | ORAL | Status: DC
  Administered 2024-08-05 – 2024-08-06 (×2): 1 via ORAL
  Filled 2024-08-04 (×2): qty 1

## 2024-08-04 MED ORDER — HYDRALAZINE HCL 20 MG/ML IJ SOLN: 5.0000 mg | INTRAMUSCULAR | Status: DC | PRN

## 2024-08-04 MED ORDER — NITROGLYCERIN 0.4 MG SL SUBL: 0.4000 mg | SUBLINGUAL_TABLET | SUBLINGUAL | Status: DC | PRN

## 2024-08-04 MED ORDER — DIAZEPAM 2 MG PO TABS: 2.0000 mg | ORAL_TABLET | Freq: Four times a day (QID) | ORAL | Status: DC | PRN

## 2024-08-04 MED ORDER — BIOTIN 2500 MCG PO CHEW: 2500.0000 ug | CHEWABLE_TABLET | Freq: Every day | ORAL | Status: DC

## 2024-08-04 MED ORDER — METOPROLOL TARTRATE 25 MG PO TABS
25.0000 mg | ORAL_TABLET | Freq: Two times a day (BID) | ORAL | Status: DC
  Administered 2024-08-04 – 2024-08-05 (×2): 25 mg via ORAL
  Filled 2024-08-04 (×2): qty 1

## 2024-08-04 MED ORDER — ASPIRIN 81 MG PO TBEC: 81.0000 mg | DELAYED_RELEASE_TABLET | Freq: Every day | ORAL | Status: DC

## 2024-08-04 MED ORDER — ALBUTEROL SULFATE (2.5 MG/3ML) 0.083% IN NEBU: 2.5000 mg | INHALATION_SOLUTION | RESPIRATORY_TRACT | Status: DC | PRN

## 2024-08-04 MED ORDER — ZOLPIDEM TARTRATE 5 MG PO TABS
5.0000 mg | ORAL_TABLET | Freq: Every evening | ORAL | Status: DC | PRN
  Administered 2024-08-05: 5 mg via ORAL
  Filled 2024-08-04: qty 1

## 2024-08-04 MED ORDER — FERROUS SULFATE 325 (65 FE) MG PO TABS
325.0000 mg | ORAL_TABLET | Freq: Every day | ORAL | Status: DC
  Administered 2024-08-05 – 2024-08-06 (×2): 325 mg via ORAL
  Filled 2024-08-04 (×2): qty 1

## 2024-08-04 MED ORDER — LORATADINE 10 MG PO TABS
10.0000 mg | ORAL_TABLET | Freq: Every day | ORAL | Status: DC
  Administered 2024-08-05 – 2024-08-06 (×2): 10 mg via ORAL
  Filled 2024-08-04 (×2): qty 1

## 2024-08-04 MED ORDER — NICOTINE 21 MG/24HR TD PT24
21.0000 mg | MEDICATED_PATCH | Freq: Every day | TRANSDERMAL | Status: DC
  Administered 2024-08-05 – 2024-08-06 (×2): 21 mg via TRANSDERMAL
  Filled 2024-08-04 (×3): qty 1

## 2024-08-04 MED ORDER — FUROSEMIDE 10 MG/ML IJ SOLN
60.0000 mg | Freq: Once | INTRAMUSCULAR | Status: AC
  Administered 2024-08-04: 60 mg via INTRAVENOUS
  Filled 2024-08-04: qty 8

## 2024-08-04 MED ORDER — OXYCODONE-ACETAMINOPHEN 5-325 MG PO TABS: 1.0000 | ORAL_TABLET | ORAL | Status: DC | PRN

## 2024-08-04 MED ORDER — FUROSEMIDE 10 MG/ML IJ SOLN
40.0000 mg | Freq: Two times a day (BID) | INTRAMUSCULAR | Status: DC
  Administered 2024-08-05 (×2): 40 mg via INTRAVENOUS
  Filled 2024-08-04 (×2): qty 4

## 2024-08-04 MED ORDER — ENOXAPARIN SODIUM 40 MG/0.4ML IJ SOSY
40.0000 mg | PREFILLED_SYRINGE | INTRAMUSCULAR | Status: DC
  Administered 2024-08-05 – 2024-08-06 (×2): 40 mg via SUBCUTANEOUS
  Filled 2024-08-04 (×3): qty 0.4

## 2024-08-04 MED ORDER — FENOFIBRATE 160 MG PO TABS
160.0000 mg | ORAL_TABLET | Freq: Every day | ORAL | Status: DC
  Administered 2024-08-05 – 2024-08-06 (×2): 160 mg via ORAL
  Filled 2024-08-04 (×2): qty 1

## 2024-08-04 MED ORDER — ALBUTEROL SULFATE HFA 108 (90 BASE) MCG/ACT IN AERS: 2.0000 | INHALATION_SPRAY | RESPIRATORY_TRACT | Status: DC | PRN

## 2024-08-04 MED ORDER — POTASSIUM CHLORIDE CRYS ER 20 MEQ PO TBCR
40.0000 meq | EXTENDED_RELEASE_TABLET | Freq: Once | ORAL | Status: AC
  Administered 2024-08-04: 40 meq via ORAL
  Filled 2024-08-04: qty 2

## 2024-08-04 MED ORDER — IPRATROPIUM-ALBUTEROL 0.5-2.5 (3) MG/3ML IN SOLN
3.0000 mL | Freq: Four times a day (QID) | RESPIRATORY_TRACT | Status: DC
  Administered 2024-08-04: 3 mL via RESPIRATORY_TRACT
  Filled 2024-08-04: qty 3

## 2024-08-04 MED ORDER — COLCHICINE 0.6 MG PO TABS: 0.6000 mg | ORAL_TABLET | Freq: Every day | ORAL | Status: DC | PRN

## 2024-08-04 MED ORDER — ACETAMINOPHEN 325 MG PO TABS
650.0000 mg | ORAL_TABLET | Freq: Four times a day (QID) | ORAL | Status: DC | PRN
  Administered 2024-08-06: 650 mg via ORAL
  Filled 2024-08-04: qty 2

## 2024-08-04 MED ORDER — INSULIN ASPART 100 UNIT/ML IJ SOLN
0.0000 [IU] | Freq: Three times a day (TID) | INTRAMUSCULAR | Status: DC
  Administered 2024-08-05 (×2): 2 [IU] via SUBCUTANEOUS
  Administered 2024-08-06: 3 [IU] via SUBCUTANEOUS
  Administered 2024-08-06: 2 [IU] via SUBCUTANEOUS
  Filled 2024-08-04: qty 3
  Filled 2024-08-04: qty 2
  Filled 2024-08-04 (×2): qty 3

## 2024-08-04 MED ORDER — AMLODIPINE BESYLATE 10 MG PO TABS
10.0000 mg | ORAL_TABLET | Freq: Every day | ORAL | Status: DC
  Administered 2024-08-05: 10 mg via ORAL
  Filled 2024-08-04: qty 1

## 2024-08-04 MED ORDER — METOPROLOL TARTRATE 5 MG/5ML IV SOLN: 2.5000 mg | INTRAVENOUS | Status: DC | PRN

## 2024-08-04 MED ORDER — CLOPIDOGREL BISULFATE 75 MG PO TABS
75.0000 mg | ORAL_TABLET | Freq: Every day | ORAL | Status: DC
  Administered 2024-08-05 – 2024-08-06 (×2): 75 mg via ORAL
  Filled 2024-08-04 (×2): qty 1

## 2024-08-04 MED ORDER — ASPIRIN 81 MG PO CHEW
324.0000 mg | CHEWABLE_TABLET | Freq: Once | ORAL | Status: AC
  Administered 2024-08-04: 324 mg via ORAL
  Filled 2024-08-04: qty 4

## 2024-08-04 MED ORDER — ROSUVASTATIN CALCIUM 10 MG PO TABS
20.0000 mg | ORAL_TABLET | Freq: Every day | ORAL | Status: DC
  Administered 2024-08-05 – 2024-08-06 (×2): 20 mg via ORAL
  Filled 2024-08-04 (×2): qty 2

## 2024-08-04 MED ORDER — LISINOPRIL 5 MG PO TABS
5.0000 mg | ORAL_TABLET | Freq: Every day | ORAL | Status: DC
  Administered 2024-08-05: 5 mg via ORAL
  Filled 2024-08-04: qty 1

## 2024-08-04 NOTE — H&P (Signed)
 " History and Physical    Alicia Ortega:[id number] DOB: [birth year] DOA: 08/04/2024  Referring MD/NP/PA:   PCP: Shona Norleen PEDLAR, MD   Patient coming from:  The patient is coming from home.     Chief Complaint: SOB  HPI: Alicia Ortega is a 74 y.o. female with medical history significant of ICM and sCHF with EF 40-50%, coronary artery disease (MI in 1987), s/p CABG, s/p of multiple PCI, PAF not on AC, T2DM, HLD, smoker, infrarenal AAA (s/p open repair 10/2017), depression with anxiety, COPD on 2.5 L of O2, who presents with SOB.  Pt states that her SOB started around 4 PM today, which has been progressively worsening.  Patient has a dry cough, no fever or chills.  She has chest tightness, but no active chest pain.  No nausea, vomiting, diarrhea or abdominal pain.  No symptoms of UTI.  No leg edema.  Data reviewed independently and ED Course: pt was found to have troponin 28, WBC 7.4, GFR> 60, temperature normal, blood pressure 137/48, heart rate 75 (intermittently tachycardic with heart rate up to 130s in ED), RR 18, oxygen saturation 95% on 4L of oxygen.  Patient is admitted to PCU as inpatient. Consulted Dr. Cherrie of card   Chest x-ray: 1. Bilateral perihilar hazy opacities and diffuse interstitial opacities, likely edema. 2. Trace bilateral pleural effusions. 3. Cardiomegaly with post-CABG changes, aortic valve replacement, and vascular clips in the mediastinum.  EKG: I have personally reviewed.  Sinus rhythm, QTc 526, ST elevation in V1-V4, left bundle blockade, poor R wave progression, anteroseptal infarction pattern.  Heart rate 86.   Review of Systems:   General: no fevers, chills, no body weight gain, has fatigue HEENT: no blurry vision, hearing changes or sore throat Respiratory: has dyspnea, coughing, no wheezing CV: has chest tightness, no palpitations GI: no nausea, vomiting, abdominal pain, diarrhea, constipation GU: no dysuria, burning on urination, increased urinary  frequency, hematuria  Ext: no leg edema Neuro: no unilateral weakness, numbness, or tingling, no vision change or hearing loss Skin: no rash, no skin tear. MSK: No muscle spasm, no deformity, no limitation of range of movement in spin Heme: No easy bruising.  Travel history: No recent long distant travel.   Allergy: Allergies[1]  Past Medical History:  Diagnosis Date   AAA (abdominal aortic aneurysm)    Anxiety    Chronic systolic CHF (congestive heart failure) (HCC)    COPD (chronic obstructive pulmonary disease) (HCC)    Coronary artery disease    Depression    Diabetes mellitus without complication (HCC)    HLD (hyperlipidemia)    PAF (paroxysmal atrial fibrillation) (HCC)    Peripheral arterial disease    Wears dentures    full upper and lower    Past Surgical History:  Procedure Laterality Date   ABDOMINAL AORTIC ANEURYSM REPAIR  10/2020   CATARACT EXTRACTION W/PHACO Right 01/07/2022   Procedure: CATARACT EXTRACTION PHACO AND INTRAOCULAR LENS PLACEMENT (IOC) RIGHT;  Surgeon: Myrna Adine Anes, MD;  Location: [facility];  Service: Ophthalmology;  Laterality: Right;  2.86 0:21.9   CATARACT EXTRACTION W/PHACO Left 01/21/2022   Procedure: CATARACT EXTRACTION PHACO AND INTRAOCULAR LENS PLACEMENT (IOC) LEFT DIABETIC;  Surgeon: Myrna Adine Anes, MD;  Location: [facility];  Service: Ophthalmology;  Laterality: Left;  Diabetic 2.11 00:18.4   CESAREAN SECTION     1977 and 1979   CHOLECYSTECTOMY     COLONOSCOPY N/A 03/06/2016   Procedure: COLONOSCOPY;  Surgeon: Claudis PENNER  Golda, MD;  Location: [facility];  Service: Endoscopy;  Laterality: N/A;  930   CORONARY ANGIOPLASTY WITH STENT PLACEMENT     stents placed   CORONARY ARTERY BYPASS GRAFT  2005   [facility]  3 vessel   FEMUR SURGERY Left 05/03/2021    Social History:  reports that she has been smoking cigarettes. She has never used smokeless tobacco. She reports current alcohol use. She reports that she  does not use drugs.  Family History:  Family History  Problem Relation Age of Onset   Parkinson's disease Brother      Prior to Admission medications  Medication Sig Start Date End Date Taking? Authorizing Provider  cetirizine (ZYRTEC) 10 MG tablet Take 10 mg by mouth daily as needed for allergies. 10/03/10  Yes [provider]  JARDIANCE  10 MG TABS tablet Take 10 mg by mouth daily. 03/21/24  Yes [provider]  lisinopril  (ZESTRIL ) 5 MG tablet Take 5 mg by mouth daily. 05/22/24  Yes [provider]  nitroGLYCERIN  (NITROSTAT ) 0.4 MG SL tablet Place 0.4 mg under the tongue every 5 (five) minutes as needed for chest pain. 06/03/24  Yes [provider]  albuterol  (PROVENTIL  HFA;VENTOLIN  HFA) 108 (90 Base) MCG/ACT inhaler Inhale 1-2 puffs into the lungs every 6 (six) hours as needed for wheezing or shortness of breath.    [provider]  amLODipine  (NORVASC ) 10 MG tablet Take 10 mg by mouth daily.    [provider]  azithromycin  (ZITHROMAX ) 250 MG tablet Take 1 tablet (250 mg total) by mouth daily. Take first 2 tablets together, then 1 every day until finished. 09/26/23   Leath-Warren, Etta PARAS, NP  benzonatate  (TESSALON ) 100 MG capsule Take 1 capsule (100 mg total) by mouth every 8 (eight) hours. 09/26/23   Leath-Warren, Etta PARAS, NP  buPROPion  (WELLBUTRIN  XL) 150 MG 24 hr tablet Take 150 mg by mouth daily.    [provider]  buPROPion  (ZYBAN ) 150 MG 12 hr tablet Take 150 mg by mouth daily.    [provider]  clopidogrel  (PLAVIX ) 75 MG tablet Take 75 mg by mouth daily.    [provider]  colchicine  0.6 MG tablet Take 0.6 mg by mouth daily as needed (flare).    [provider]  diazepam  (VALIUM ) 2 MG tablet Take 2 mg by mouth every 6 (six) hours as needed for anxiety.    [provider]  DULoxetine (CYMBALTA) 30 MG capsule Take 30 mg by mouth daily.    [provider]  fenofibrate   micronized (LOFIBRA) 134 MG capsule Take 134 mg by mouth daily before breakfast.    [provider]  gabapentin (NEURONTIN) 300 MG capsule Take 300 mg by mouth 3 (three) times daily. Patient not taking: Reported on 12/31/2021    [provider]  levocetirizine (XYZAL) 5 MG tablet Take 5 mg by mouth at bedtime as needed for allergies.    [provider]  loratadine  (CLARITIN ) 10 MG tablet Take 10 mg by mouth daily.    [provider]  LORazepam (ATIVAN) 0.5 MG tablet Take 0.5 mg by mouth every 8 (eight) hours.    [provider]  metFORMIN (GLUCOPHAGE) 1000 MG tablet Take 1,000 mg by mouth 2 (two) times daily.    [provider]  metoprolol  tartrate (LOPRESSOR ) 25 MG tablet Take 25 mg by mouth 2 (two) times daily.    [provider]  Multiple Vitamin (MULTIVITAMIN WITH MINERALS) TABS tablet Take 1 tablet  by mouth daily.    [provider]  oxyCODONE  (OXY IR/ROXICODONE ) 5 MG immediate release tablet Take 5 mg by mouth every 4 (four) hours as needed for severe pain.    [provider]  pantoprazole  (PROTONIX ) 40 MG tablet Take 1 tablet (40 mg total) by mouth 2 (two) times daily. 05/19/21   Evonnie Lenis, MD  rosuvastatin  (CRESTOR ) 20 MG tablet Take 20 mg by mouth daily.    [provider]  Vitamin D3 (VITAMIN D ) 25 MCG tablet Take 1,000 Units by mouth daily.    [provider]  zolpidem  (AMBIEN ) 5 MG tablet Take 5 mg by mouth daily.    [provider]    Physical Exam: Vitals:   08/04/24 2300 08/04/24 2330 08/04/24 2340 08/04/24 2351  BP: 124/63 (!) 121/57 (!) 121/57   Pulse: (!) 131 60 (!) 130   Resp: 19 20    Temp:    98.6 F (37 C)  TempSrc:    Oral  SpO2: 94% 95%    Weight:      Height:       General: Not in acute distress HEENT:       Eyes: PERRL, EOMI, no jaundice       ENT: No discharge from the ears and nose, no pharynx injection, no tonsillar enlargement.        Neck: positive  JVD, no bruit, no mass felt. Heme: No neck lymph node enlargement. Cardiac: S1/S2, RRR, No gallops or rubs. Respiratory: Has fine crackles bilaterally, has decreased air movement bilaterally, no wheezing. GI: Soft, nondistended, nontender, no rebound pain, no organomegaly, BS present. GU: No hematuria Ext: No pitting leg edema bilaterally. 1+DP/PT pulse bilaterally. Musculoskeletal: No joint deformities, No joint redness or warmth, no limitation of ROM in spin. Skin: No rashes.  Neuro: Alert, oriented X3, cranial nerves II-XII grossly intact, moves all extremities normally. Psych: Patient is not psychotic, no suicidal or hemocidal ideation.  Labs on Admission: I have personally reviewed following labs and imaging studies  CBC: Recent Labs  Lab 08/04/24 1929  WBC 7.4  HGB 11.0*  HCT 33.8*  MCV 86.0  PLT 177   Basic Metabolic Panel: Recent Labs  Lab 08/04/24 1929  NA 138  K 3.4*  CL 103  CO2 20*  GLUCOSE 417*  BUN 17  CREATININE 0.90  CALCIUM  9.2  MG 1.7  PHOS 3.5   GFR: Estimated Creatinine Clearance: 47.3 mL/min (by C-G formula based on SCr of 0.9 mg/dL). Liver Function Tests: No results for input(s): AST, ALT, ALKPHOS, BILITOT, PROT, ALBUMIN in the last 168 hours. No results for input(s): LIPASE, AMYLASE in the last 168 hours. No results for input(s): AMMONIA in the last 168 hours. Coagulation Profile: Recent Labs  Lab 08/04/24 1929  INR 1.2   Cardiac Enzymes: No results for input(s): CKTOTAL, CKMB, CKMBINDEX, TROPONINI in the last 168 hours. BNP (last 3 results) Recent Labs    08/04/24 1929  PROBNP 3,752.0*   HbA1C: No results for input(s): HGBA1C in the last 72 hours. CBG: Recent Labs  Lab 08/04/24 2313  GLUCAP 305*   Lipid Profile: No results for input(s): CHOL, HDL, LDLCALC, TRIG, CHOLHDL, LDLDIRECT in the last 72 hours. Thyroid  Function Tests: No results for input(s): TSH, T4TOTAL, FREET4,  T3FREE, THYROIDAB in the last 72 hours. Anemia Panel: No results for input(s): VITAMINB12, FOLATE, FERRITIN, TIBC, IRON, RETICCTPCT in the last 72 hours. Urine analysis:    Component Value Date/Time   COLORURINE YELLOW 10/23/2013 2110  APPEARANCEUR CLEAR 10/23/2013 2110   LABSPEC <1.005 (L) 10/23/2013 2110   PHURINE 6.0 10/23/2013 2110   GLUCOSEU NEGATIVE 10/23/2013 2110   HGBUR NEGATIVE 10/23/2013 2110   BILIRUBINUR NEGATIVE 10/23/2013 2110   KETONESUR NEGATIVE 10/23/2013 2110   PROTEINUR NEGATIVE 10/23/2013 2110   UROBILINOGEN 0.2 10/23/2013 2110   NITRITE NEGATIVE 10/23/2013 2110   LEUKOCYTESUR NEGATIVE 10/23/2013 2110   Sepsis Labs: @LABRCNTIP (procalcitonin:4,lacticidven:4) )No results found for this or any previous visit (from the past 240 hours).   Radiological Exams on Admission:   Assessment/Plan Principal Problem:   Acute on chronic systolic CHF (congestive heart failure) (HCC) Active Problems:   CAD (coronary artery disease)   Myocardial injury   PAF (paroxysmal atrial fibrillation) (HCC)   HLD (hyperlipidemia)   HTN (hypertension)   COPD (chronic obstructive pulmonary disease) (HCC)   Diabetes mellitus without complication (HCC)   Iron deficiency anemia   Hypokalemia   Smoker   Prolonged QT interval   Depression with anxiety   Assessment and Plan:   Acute on chronic systolic CHF (congestive heart failure) (HCC): Patient does not have leg edema, but has elevated BNP 3752, interstitial pulmonary edema on chest x-ray and crackles on lung auscultation.  Clinically consistent with CHF exacerbation.  2D echo on 06/21/2020 showed EF of 45-50%.  Consulted Dr. Cherrie of card  -Will admit to tele PCU as inpatient -Lasix  40 mg bid by IV (patient received 60 mg of Lasix  in ED) -2d echo -Daily weights -strict I/O's -Low salt diet -Fluid restriction -As needed bronchodilators for shortness of breath  CAD (coronary artery disease) and  Myocardial injury: Troponin 28 --> 50 --> 56.  EKG showed ST elevation in V1-V4 and left bundle blockade.she has chest tightness, but no active chest pain.  Troponin is mildly elevated.  Does not seem to have STEMI.  Likely due to demand ischemia.  Consulted Dr. Bensimhon of card  - Trend Trop - prn Percocet, Tylenol  for pain - prn Nitroglycerin  - ASA 324 mg x 1 - continue home Plavix  - Statin: Crestor  20 mg daily - Risk factor stratification: will check FLP and A1C  - Follow-up 2d echo  Hx of PAF (paroxysmal atrial fibrillation) [facility]): Patient is not taking anticoagulants.  Heart rate 75 --> 130s - Continue metoprolol  25 mg twice daily - IV metoprolol  2.5 mg every 2 hour for heart rate> 125  HLD (hyperlipidemia) -Crestor  and fenofibrate   HTN (hypertension) -IV hydralazine  as needed - Metoprolol , amlodipine , lisinopril   COPD (chronic obstructive pulmonary disease) (HCC): Stable.  No wheezing -Bronchodilators and as needed Mucinex   Diabetes mellitus without complication (HCC): A1c 5.1 on 05/19/2021.  No recent A1c available.  Patient is taking metformin at home - SSI - Follow-up A1c  Iron deficiency anemia: Hemoglobin stable 11.0 (8.9 on 05/19/2021) -Continue iron supplement  Hypokalemia: Potassium 3.4, magnesium  1.7, phosphorus 3.5 - Repleted potassium  Smoker -Did counseling about importance of quitting smoking - Nicotine  patch  Prolonged QT interval: QTc 526. - Hold Cymbalta, Wellbutrin  - Give 1 g magnesium  sulfate (magnesium  1.7)  Depression with anxiety - Continue home Valium  - Hold Wellbutrin  and Cymbalta due to QTc prolonging agent      DVT ppx: SQ Lovenox   Code Status: Full code  Family Communication:     not done, no family member is at bed side.   Disposition Plan:  Anticipate discharge back to previous environment  Consults called:Consulted Dr. Cherrie of card  Admission status and Level of care: Progressive:  as inpt  Dispo: The  patient is from: Home              Anticipated d/c is to: Home              Anticipated d/c date is: 2 days              Patient currently is not medically stable to d/c.    Severity of Illness:  The appropriate patient status for this patient is INPATIENT. Inpatient status is judged to be reasonable and necessary in order to provide the required intensity of service to ensure the patient's safety. The patient's presenting symptoms, physical exam findings, and initial radiographic and laboratory data in the context of their chronic comorbidities is felt to place them at high risk for further clinical deterioration. Furthermore, it is not anticipated that the patient will be medically stable for discharge from the hospital within 2 midnights of admission.   * I certify that at the point of admission it is my clinical judgment that the patient will require inpatient hospital care spanning beyond 2 midnights from the point of admission due to high intensity of service, high risk for further deterioration and high frequency of surveillance required.*       Date of Service 08/05/2024    Caleb Exon [facility]   If 7PM-7AM, please contact night-coverage [email or web address] 08/05/2024, 12:48 AM     [1]  Allergies Allergen Reactions   Oxycodone  Itching   Codeine Itching and Rash   Hydrocodone Itching and Rash   Lipitor [Atorvastatin] Itching and Rash   "

## 2024-08-04 NOTE — ED Notes (Signed)
 Lab to add on Pro BNP to previously collected lab sample.

## 2024-08-04 NOTE — ED Triage Notes (Signed)
 Pt in from home via [facility] with acute onset sob, EMS states pt was tripoding and diaphoretic on their arrival. Pt took 4 home nebs PTA, EMS gave one additional. EMS states elevation present in 4 of her leads, pt began to c/o cp while en route.   EMS VS: 168/90 90's NSR 97% 3-4LNC 3 MI's Stent placement CABG hx

## 2024-08-04 NOTE — ED Provider Notes (Signed)
 "  [facility] Provider Note    Event Date/Time   First MD Initiated Contact with Patient 08/04/24 1936     (approximate)   History   Shortness of Breath   HPI  Alicia Ortega is a 74 y.o. female presents to the emergency department today because of concerns for shortness of breath.  Patient states she was in her normal state of health earlier in the day.  Then around 4:00 she started noticing some chest discomfort across her chest.  She describes it as a tightness.  She then started belting some shortness of breath.  She was placed on oxygen and does feel better.  She denies normally being on oxygen.  She denies any swelling to her arms or legs.   Physical Exam   Triage Vital Signs: ED Triage Vitals  Encounter Vitals Group     BP 08/04/24 1920 (!) 137/48     Girls Systolic BP Percentile --      Girls Diastolic BP Percentile --      Boys Systolic BP Percentile --      Boys Diastolic BP Percentile --      Pulse Rate 08/04/24 1920 75     Resp 08/04/24 1920 18     Temp 08/04/24 1920 98.1 F (36.7 C)     Temp Source 08/04/24 1920 Oral     SpO2 08/04/24 1920 95 %     Weight 08/04/24 1925 113 lb 15.7 oz (51.7 kg)     Height --      Head Circumference --      Peak Flow --      Pain Score 08/04/24 1925 5     Pain Loc --      Pain Education --      Exclude from Growth Chart --     Most recent vital signs: Vitals:   08/04/24 1920  BP: (!) 137/48  Pulse: 75  Resp: 18  Temp: 98.1 F (36.7 C)  SpO2: 95%   General: Awake, alert, oriented. CV:  Good peripheral perfusion. Regular rate and rhythm. Resp:  Slightly increased work of breathing. Crackles noted in bilateral bases. Abd:  No distention.   ED Results / Procedures / Treatments   Labs (all labs ordered are listed, but only abnormal results are displayed) Labs Reviewed  BASIC METABOLIC PANEL WITH GFR - Abnormal; Notable for the following components:      Result Value   Potassium 3.4 (*)     CO2 20 (*)    Glucose, Bld 417 (*)    All other components within normal limits  CBC - Abnormal; Notable for the following components:   Hemoglobin 11.0 (*)    HCT 33.8 (*)    All other components within normal limits  PRO BRAIN NATRIURETIC PEPTIDE - Abnormal; Notable for the following components:   Pro Brain Natriuretic Peptide 3,752.0 (*)    All other components within normal limits  TROPONIN T, HIGH SENSITIVITY - Abnormal; Notable for the following components:   Troponin T High Sensitivity 28 (*)    All other components within normal limits  TROPONIN T, HIGH SENSITIVITY     EKG  I, Guadalupe Eagles, attending physician, personally viewed and interpreted this EKG  EKG Time: 1923 Rate: 86 Rhythm: normal sinus rhythm Axis: normal Intervals: qtc 526 QRS: LBBB ST changes: no st elevation Impression: abnormal ekg   RADIOLOGY I independently interpreted and visualized the CXR. My interpretation: bilateral opacities Radiology interpretation:  IMPRESSION:  1. Bilateral perihilar hazy opacities and diffuse interstitial opacities,  likely edema.  2. Trace bilateral pleural effusions.  3. Cardiomegaly with post-CABG changes, aortic valve replacement, and vascular  clips in the mediastinum.         PROCEDURES:  Critical Care performed: No    MEDICATIONS ORDERED IN ED: Medications - No data to display   IMPRESSION / MDM / ASSESSMENT AND PLAN / ED COURSE  I reviewed the triage vital signs and the nursing notes.                              Differential diagnosis includes, but is not limited to, pneumonia, COPD, CHF  Patient's presentation is most consistent with acute presentation with potential threat to life or bodily function.   Patient presented to the emergency department today because of concerns for shortness of breath accompanied by some chest discomfort.  On exam patient has crackles at bilateral bases.  Chest x-ray concerning for possible edema.   Patient denies history of heart failure.  BNP was added onto blood work and was elevated.  Additionally there was a slight elevation of troponin.  At this time I have low concern for ACS and think likely troponin elevation is secondary to heart failure.  Will give patient Lasix  here in the emergency department.  Will plan on admission for further workup and evaluation.  Discussed findings and plan with patient.     FINAL CLINICAL IMPRESSION(S) / ED DIAGNOSES   Final diagnoses:  Acute pulmonary edema (HCC)       Note:  This document was prepared using Dragon voice recognition software and may include unintentional dictation errors.    Floy Roberts, MD 08/04/24 2211  "

## 2024-08-05 DIAGNOSIS — I255 Ischemic cardiomyopathy: Secondary | ICD-10-CM

## 2024-08-05 DIAGNOSIS — D509 Iron deficiency anemia, unspecified: Secondary | ICD-10-CM | POA: Diagnosis present

## 2024-08-05 DIAGNOSIS — J810 Acute pulmonary edema: Principal | ICD-10-CM

## 2024-08-05 DIAGNOSIS — I25118 Atherosclerotic heart disease of native coronary artery with other forms of angina pectoris: Secondary | ICD-10-CM

## 2024-08-05 DIAGNOSIS — F17200 Nicotine dependence, unspecified, uncomplicated: Secondary | ICD-10-CM

## 2024-08-05 DIAGNOSIS — E876 Hypokalemia: Secondary | ICD-10-CM

## 2024-08-05 DIAGNOSIS — I5023 Acute on chronic systolic (congestive) heart failure: Secondary | ICD-10-CM | POA: Diagnosis not present

## 2024-08-05 DIAGNOSIS — J9620 Acute and chronic respiratory failure, unspecified whether with hypoxia or hypercapnia: Secondary | ICD-10-CM

## 2024-08-05 LAB — LIPID PANEL
Cholesterol: 129 mg/dL (ref 0–200)
HDL: 28 mg/dL — ABNORMAL LOW
LDL Cholesterol: 48 mg/dL (ref 0–99)
Total CHOL/HDL Ratio: 4.6 ratio
Triglycerides: 265 mg/dL — ABNORMAL HIGH
VLDL: 53 mg/dL — ABNORMAL HIGH (ref 0–40)

## 2024-08-05 LAB — TROPONIN T, HIGH SENSITIVITY
Troponin T High Sensitivity: 56 ng/L — ABNORMAL HIGH (ref 0–19)
Troponin T High Sensitivity: 80 ng/L — ABNORMAL HIGH (ref 0–19)
Troponin T High Sensitivity: 81 ng/L — ABNORMAL HIGH (ref 0–19)
Troponin T High Sensitivity: 87 ng/L — ABNORMAL HIGH (ref 0–19)

## 2024-08-05 LAB — CBC
HCT: 31.8 % — ABNORMAL LOW (ref 36.0–46.0)
Hemoglobin: 10.5 g/dL — ABNORMAL LOW (ref 12.0–15.0)
MCH: 28.1 pg (ref 26.0–34.0)
MCHC: 33 g/dL (ref 30.0–36.0)
MCV: 85 fL (ref 80.0–100.0)
Platelets: 185 K/uL (ref 150–400)
RBC: 3.74 MIL/uL — ABNORMAL LOW (ref 3.87–5.11)
RDW: 14.1 % (ref 11.5–15.5)
WBC: 7.7 K/uL (ref 4.0–10.5)
nRBC: 0 % (ref 0.0–0.2)

## 2024-08-05 LAB — GLUCOSE, CAPILLARY
Glucose-Capillary: 185 mg/dL — ABNORMAL HIGH (ref 70–99)
Glucose-Capillary: 161 mg/dL — ABNORMAL HIGH (ref 70–99)
Glucose-Capillary: 105 mg/dL — ABNORMAL HIGH (ref 70–99)
Glucose-Capillary: 233 mg/dL — ABNORMAL HIGH (ref 70–99)

## 2024-08-05 LAB — HEMOGLOBIN A1C
Hgb A1c MFr Bld: 8.5 % — ABNORMAL HIGH (ref 4.8–5.6)
Mean Plasma Glucose: 197.25 mg/dL

## 2024-08-05 LAB — BASIC METABOLIC PANEL WITH GFR
Anion gap: 13 (ref 5–15)
BUN: 17 mg/dL (ref 8–23)
CO2: 22 mmol/L (ref 22–32)
Calcium: 9.1 mg/dL (ref 8.9–10.3)
Chloride: 104 mmol/L (ref 98–111)
Creatinine, Ser: 0.95 mg/dL (ref 0.44–1.00)
GFR, Estimated: >60 mL/min
Glucose, Bld: 247 mg/dL — ABNORMAL HIGH (ref 70–99)
Potassium: 3.5 mmol/L (ref 3.5–5.1)
Sodium: 139 mmol/L (ref 135–145)

## 2024-08-05 LAB — MAGNESIUM: Magnesium: 1.7 mg/dL (ref 1.7–2.4)

## 2024-08-05 MED ORDER — IPRATROPIUM-ALBUTEROL 0.5-2.5 (3) MG/3ML IN SOLN: 3.0000 mL | Freq: Four times a day (QID) | RESPIRATORY_TRACT | Status: DC

## 2024-08-05 MED ORDER — METOPROLOL SUCCINATE ER 50 MG PO TB24
50.0000 mg | ORAL_TABLET | Freq: Every evening | ORAL | Status: DC
  Administered 2024-08-05: 50 mg via ORAL
  Filled 2024-08-05: qty 1

## 2024-08-05 MED ORDER — MAGNESIUM SULFATE IN D5W 1-5 GM/100ML-% IV SOLN
1.0000 g | Freq: Once | INTRAVENOUS | Status: AC
  Administered 2024-08-05: 1 g via INTRAVENOUS
  Filled 2024-08-05: qty 100

## 2024-08-05 MED ORDER — NITROGLYCERIN 0.4 MG SL SUBL: 0.4000 mg | SUBLINGUAL_TABLET | SUBLINGUAL | Status: DC | PRN

## 2024-08-05 MED ORDER — POTASSIUM CHLORIDE CRYS ER 20 MEQ PO TBCR
40.0000 meq | EXTENDED_RELEASE_TABLET | Freq: Once | ORAL | Status: AC
  Administered 2024-08-05: 40 meq via ORAL
  Filled 2024-08-05: qty 2

## 2024-08-05 MED ORDER — IPRATROPIUM-ALBUTEROL 0.5-2.5 (3) MG/3ML IN SOLN: 3.0000 mL | Freq: Four times a day (QID) | RESPIRATORY_TRACT | Status: DC | PRN

## 2024-08-05 MED ORDER — LISINOPRIL 20 MG PO TABS
20.0000 mg | ORAL_TABLET | Freq: Every day | ORAL | Status: DC
  Administered 2024-08-06: 20 mg via ORAL
  Filled 2024-08-05: qty 1

## 2024-08-05 MED ORDER — INSULIN GLARGINE 100 UNIT/ML ~~LOC~~ SOLN
13.0000 [IU] | Freq: Every day | SUBCUTANEOUS | Status: DC
  Administered 2024-08-05 – 2024-08-06 (×2): 13 [IU] via SUBCUTANEOUS
  Filled 2024-08-05 (×2): qty 0.13

## 2024-08-05 NOTE — Plan of Care (Signed)

## 2024-08-05 NOTE — Progress Notes (Addendum)
 Heart Failure Stewardship Pharmacy Note  PCP: Shona Norleen PEDLAR, MD PCP-Cardiologist: None  HPI: Alicia Ortega is a 73 y.o. female with ICM and sCHF with EF 40-50%, coronary artery disease (MI in 1987), s/p CABG, s/p of multiple PCI, PAF not on AC, T2DM, HLD, smoker, infrarenal AAA (s/p open repair 10/2017), depression with anxiety, COPD on 2.5 L of O2, who presented with SOB.   On admission, proBNP was 3752, HS-troponin was 28 and later 56, Scr 0.95, K 3.5, Mg 1.7, A1c 8.5%. Chest x-ray noted bilateral perihilar hazy opacities and diffuse interstitial opacities, likely edema. Trace bilateral pleural effusions.   EKG: Sinus rhythm, QTc 526, ST elevation in V1-V4, left bundle blockade, poor R wave progression, anteroseptal infarction pattern.   TTE 08/05/24 pending.  Pertinent cardiac history: - MI in 1987, MVR 2005, s/p CABG, s/p of multiple PCI, PAF not on AC. 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 Prior CABG with patent LIMA to LAD. Successful PCI of SVG to IPDA and SCG to OM2.   Pertinent Lab Values: Creatinine, Ser  Date Value Ref Range Status  08/05/2024 0.95 0.44 - 1.00 mg/dL Final   BUN  Date Value Ref Range Status  08/05/2024 17 8 - 23 mg/dL Final   Potassium  Date Value Ref Range Status  08/05/2024 3.5 3.5 - 5.1 mmol/L Final   Sodium  Date Value Ref Range Status  08/05/2024 139 135 - 145 mmol/L Final   Magnesium   Date Value Ref Range Status  08/04/2024 1.7 1.7 - 2.4 mg/dL Final    Comment:    Performed at [facility], [address] [place], [place] [address]   Hgb A1c MFr Bld  Date Value Ref Range Status  08/04/2024 8.5 (H) 4.8 - 5.6 % Final    Comment:    (NOTE) Diagnosis of Diabetes The following HbA1c ranges recommended by the American Diabetes Association (ADA) may be used as an aid in the diagnosis of diabetes  mellitus.  Hemoglobin             Suggested A1C NGSP%              Diagnosis  <5.7                   Non Diabetic  5.7-6.4                Pre-Diabetic  >6.4                   Diabetic  <7.0                   Glycemic control for                       adults with diabetes.      Vital Signs: Temp:  [97.9 F (36.6 C)-98.6 F (37 C)] 97.9 F (36.6 C) (01/08 0304) Pulse Rate:  [60-131] 66 (01/08 0304) Cardiac Rhythm: Normal sinus rhythm (01/08 0741) Resp:  [18-30] 20 (01/08 0304) BP: (121-165)/(48-88) 143/66 (01/08 0304) SpO2:  [94 %-96 %] 96 % (01/08 0304) Weight:  [50.3 kg (110 lb 14.4 oz)-63.7 kg (140 lb 6.9 oz)] 50.3 kg (110 lb 14.4 oz) (01/08 0500)  Intake/Output Summary (Last 24 hours) at 08/05/2024 0801 Last data filed at 08/05/2024 0600 Gross per 24 hour  Intake 95.4 ml  Output 900 ml  Net -804.6 ml    Current Heart Failure Medications:  Loop diuretic: furosemide  40 mg IV q12h Beta-Blocker: metoprolol  succinate 50 mg daily ACEI/ARB/ARNI: lisinopril  20 mg daily MRA: none SGLT2i: none Other: none  Prior to admission Heart Failure Medications:  Loop diuretic: none Beta-Blocker: metoprolol  tartrate 25 mg BID  ACEI/ARB/ARNI: lisinopril  5 mg daily MRA: none SGLT2i: Jardiance  10 mg (not taking) Other: amlodipine  10 mg daily  Assessment: 1. Acute on chronic systolic heart failure (LVEF 45-50%) mild to moderate mitral valve regurgitation (05/2020), due to ICM. NYHA class II-III symptoms.   TTE ordered for today.  -Symptoms: Reports significantly improved SOB, orthopnea remains present.   -Volume: Appears visually euvolemic. No LEE. No JVD.  -Hemodynamics: BP 140-160s/60-70s, HR 60-70s -BB: Agree with changing metoprolol  tartrate to metoprolol  succinate. -ACEI/ARB/ARNI: Agree with increasing lisinopril  5 mg daily to 20 mg daily.  -MRA: Can consider adding spironolactone 25 mg daily. Scr 0.91, K 3.5. -SGLT2i: Can consider restarting home Jardiance  10 mg daily.  Patient reports not taking home (last filled 03/21/24 for #90). Unclear the reason for stopping. A1c 8.7%. No symptoms of UTI.  Plan: 1) Medication changes recommended at this time: - Recommend Mg 4 g IV over 60 min for goal Mg > 2  - Can consider adding spironolactone 25 mg daily today. - Can consider restarting home Jardiance  10 mg daily today.  2) Patient assistance: - Pending.  3) Education: - Patient has been educated on current HF medications and potential additions to HF medication regimen - Patient verbalizes understanding that over the next few months, these medication doses may change and more medications may be added to optimize HF regimen - Patient has been educated on basic disease state pathophysiology and goals of therapy  Calton Nash, [email or web address] PharmD Candidate  Please do not hesitate to reach out with questions or concerns,  Jaun Bash, PharmD, CPP, BCPS, [facility] Heart Failure Pharmacist  Phone - [phone] 08/05/2024 1:56 PM

## 2024-08-05 NOTE — Consult Note (Signed)
 "  Cardiology Consultation   Patient ID: Alicia Ortega MRN: [id number]; DOB: June 17, 1951  Admit date: 08/04/2024 Date of Consult: 08/05/2024  PCP:  Alicia Norleen PEDLAR, MD   [facility] Providers Cardiologist:  None        Patient Profile: Alicia Ortega is a 74 y.o. female with a hx of CAD s/p CABG and MVR in 2005 and multiple PCIs, ICM with EF 45-50% in 2021, T2DM, hypertlipidemia, tobacco abuse, infrarenal AAA s/p open repair in 2019, and COPD who is being seen 08/05/2024 for the evaluation of shortness of breath at the request of Dr. Hilma.  History of Present Illness: Ms. Alicia Ortega follows with [facility] cardiology as an outpatient but has not been seen since 2022. Most recent echo 05/2020 with EF 45 to 50%.  Most recent ischemic evaluation 07/2020 with cardiac catheterization revealing 20% mid RCA stenosis, 90% proximal lesion in OM1, and 100% occlusion of mid circumflex, 90% D1 stenosis and 95% proximal LAD stenosis with successful PCI SVG to high PDA and SVG to OM 2.  Patient reports shortness of breath ongoing since Tuesday which worsened significantly yesterday evening while she was resting. She describes this as gasping for breath with associates chest tightness. This was not similar to angina she's had in the past. She denies chest pain, lightheadedness, dizziness, palpitations, and lower extremity swelling.  In the ED, patient was afebrile and hypertensive with BP 137/48 and otherwise normal vital signs.  Pertinent labs include potassium 3.4, hemoglobin 11, and hematocrit 33.8.  proBNP 3752.  Troponin mildly elevated and flat trending.  EKG with nonspecific ST/T wave abnormalities.  CXR revealed bilateral perihilar hazy opacity and diffuse interstitial opacities, likely edema with trace bilateral pleural effusions.  Patient was given IV Lasix  60 mg x 1 and potassium was repleted.  Cardiology was asked to consult for further evaluation of CHF exacerbation.  At time of cardiology consult, patient  reports improvements in breathing.  She denies further chest tightness.   Past Medical History:  Diagnosis Date   AAA (abdominal aortic aneurysm)    Anxiety    Chronic systolic CHF (congestive heart failure) (HCC)    COPD (chronic obstructive pulmonary disease) (HCC)    Coronary artery disease    Depression    Diabetes mellitus without complication (HCC)    HLD (hyperlipidemia)    PAF (paroxysmal atrial fibrillation) (HCC)    Peripheral arterial disease    Wears dentures    full upper and lower    Past Surgical History:  Procedure Laterality Date   ABDOMINAL AORTIC ANEURYSM REPAIR  10/2020   CATARACT EXTRACTION W/PHACO Right 01/07/2022   Procedure: CATARACT EXTRACTION PHACO AND INTRAOCULAR LENS PLACEMENT (IOC) RIGHT;  Surgeon: Myrna Adine Anes, MD;  Location: [facility];  Service: Ophthalmology;  Laterality: Right;  2.86 0:21.9   CATARACT EXTRACTION W/PHACO Left 01/21/2022   Procedure: CATARACT EXTRACTION PHACO AND INTRAOCULAR LENS PLACEMENT (IOC) LEFT DIABETIC;  Surgeon: Myrna Adine Anes, MD;  Location: [facility];  Service: Ophthalmology;  Laterality: Left;  Diabetic 2.11 00:18.4   CESAREAN SECTION     1977 and 1979   CHOLECYSTECTOMY     COLONOSCOPY N/A 03/06/2016   Procedure: COLONOSCOPY;  Surgeon: Claudis RAYMOND Rivet, MD;  Location: [facility];  Service: Endoscopy;  Laterality: N/A;  930   CORONARY ANGIOPLASTY WITH STENT PLACEMENT     stents placed   CORONARY ARTERY BYPASS GRAFT  2005   [facility]  3 vessel   FEMUR SURGERY Left  05/03/2021       Scheduled Meds:  amLODipine   10 mg Oral Daily   vitamin D3  1,000 Units Oral Daily   clopidogrel   75 mg Oral Daily   enoxaparin  (LOVENOX ) injection  40 mg Subcutaneous Q24H   fenofibrate   160 mg Oral Daily   ferrous sulfate   325 mg Oral Q breakfast   furosemide   40 mg Intravenous Q12H   insulin  aspart  0-5 Units Subcutaneous QHS   insulin  aspart  0-9 Units Subcutaneous TID WC   ipratropium-albuterol   3 mL  Nebulization Q6H   lisinopril   5 mg Oral Daily   loratadine   10 mg Oral Daily   metoprolol  tartrate  25 mg Oral BID   multivitamin with minerals  1 tablet Oral Daily   nicotine   21 mg Transdermal Daily   rosuvastatin   20 mg Oral Daily   Continuous Infusions:  PRN Meds: acetaminophen , albuterol , colchicine , dextromethorphan -guaiFENesin , diazepam , diphenhydrAMINE , hydrALAZINE , metoprolol  tartrate, nitroGLYCERIN , oxyCODONE -acetaminophen , zolpidem   Allergies:   Allergies[1]  Social History:   Social History   Socioeconomic History   Marital status: Divorced    Spouse name: Not on file   Number of children: Not on file   Years of education: Not on file   Highest education level: Not on file  Occupational History   Not on file  Tobacco Use   Smoking status: Every Day    Current packs/day: 0.00    Types: Cigarettes    Last attempt to quit: 10/2020    Years since quitting: 3.7   Smokeless tobacco: Never  Vaping Use   Vaping status: Never Used  Substance and Sexual Activity   Alcohol use: Yes    Comment: rarely   Drug use: No   Sexual activity: Not on file  Other Topics Concern   Not on file  Social History Narrative   Not on file   Social Drivers of Health   Tobacco Use: High Risk (08/04/2024)   Patient History    Smoking Tobacco Use: Every Day    Smokeless Tobacco Use: Never    Passive Exposure: Not on file  Financial Resource Strain: Not on file  Food Insecurity: Not on file  Transportation Needs: Not on file  Physical Activity: Not on file  Stress: Not on file  Social Connections: Not on file  Intimate Partner Violence: Not on file  Depression (PHQ2-9): Low Risk (06/16/2023)   Depression (PHQ2-9)    PHQ-2 Score: 1  Alcohol Screen: Not on file  Housing: Not on file  Utilities: Not on file  Health Literacy: Not on file    Family History:    Family History  Problem Relation Age of Onset   Parkinson's disease Brother      ROS:  Please see the history of  present illness.   All other ROS reviewed and negative.     Physical Exam/Data: Vitals:   08/05/24 0030 08/05/24 0100 08/05/24 0304 08/05/24 0500  BP: (!) 165/79 (!) 137/50 (!) 143/66   Pulse:  70 66   Resp: (!) 21 (!) 25 20   Temp:   97.9 F (36.6 C)   TempSrc:      SpO2: 94% 96% 96%   Weight:    50.3 kg  Height:        Intake/Output Summary (Last 24 hours) at 08/05/2024 0820 Last data filed at 08/05/2024 0600 Gross per 24 hour  Intake 95.4 ml  Output 900 ml  Net -804.6 ml      08/05/2024  5:00 AM 08/04/2024    8:54 PM 08/04/2024    7:25 PM  Last 3 Weights  Weight (lbs) 110 lb 14.4 oz 140 lb 6.9 oz 113 lb 15.7 oz  Weight (kg) 50.304 kg 63.7 kg 51.7 kg     Body mass index is 20.28 kg/m.  General:  Well nourished, well developed, in no acute distress HEENT: normal Neck: no JVD Vascular: No carotid bruits; Distal pulses 2+ bilaterally Cardiac:  normal S1, S2; RRR; no murmur  Lungs:  Diminished at the bases Abd: soft, nontender, no hepatomegaly  Ext: no edema Skin: warm and dry  Psych:  Normal affect   EKG:  The EKG was personally reviewed and demonstrates:  Sinus rhythm with LBBB and nonspecific ST/T wave abnormalities, rate 86 bpnm Telemetry:  Telemetry was personally reviewed and demonstrates: Sinus rhythm  Relevant CV Studies:  07/2020 Cardiac cath [facility]) 1. 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 Coronary artery disease including 20% mid-RCA stenosis, 90% proximal  lesion in OM1, 100% occlusion of mid-circumflex, 90% D1 stenosis and 95%  proximal LAD stenosis  3. Prior coronary bypass surgery with patent LIMA to LAD. The SVG to left  PDA is patent with a 50% distal stenosis. The SVG to OM2 is patent with a  70% mid graft stenosis  4. Successful PCI of SVG to lPDA with placement of a Xience drug eluting  stent  5. Successful PCI of SVG to OM2 with placement of a Xience drug eluting  stent   05/2020 2D echo [facility])  1. The left ventricle is  normal in size with mildly increased wall  thickness.   2. The left ventricular systolic function is mildly decreased, LVEF is  visually estimated at 45-50%.    3. There is thinning and akinesis involving the apical septal and mid  anteroseptal segment(s).    4. Mitral valve repair with 28 mm Futura annuloplasty ring (2005).    5. The mitral valve leaflets are mildly thickened with mildly reduced  leaflet mobility.    6. There is mild to moderate mitral valve regurgitation.    7. There is mild mitral stenosis.    8. The aortic valve is trileaflet with moderately thickened leaflets with  probably normal excursion.    9. The left atrium is mildly dilated in size.    10. The right ventricle is normal in size, with normal systolic function.   Laboratory Data: High Sensitivity Troponin:  No results for input(s): TROPONINIHS in the last 720 hours.  Recent Labs  Lab 08/04/24 1929 08/04/24 2126 08/04/24 2352 08/05/24 0427 08/05/24 0620  TRNPT 28* 50* 56* 80* 87*      Chemistry Recent Labs  Lab 08/04/24 1929 08/05/24 0427  NA 138 139  K 3.4* 3.5  CL 103 104  CO2 20* 22  GLUCOSE 417* 247*  BUN 17 17  CREATININE 0.90 0.95  CALCIUM  9.2 9.1  MG 1.7  --   GFRNONAA >60 >60  ANIONGAP 15 13    No results for input(s): PROT, ALBUMIN, AST, ALT, ALKPHOS, BILITOT in the last 168 hours. Lipids  Recent Labs  Lab 08/05/24 0427  CHOL 129  TRIG 265*  HDL 28*  LDLCALC 48  CHOLHDL 4.6    Hematology Recent Labs  Lab 08/04/24 1929 08/05/24 0427  WBC 7.4 7.7  RBC 3.93 3.74*  HGB 11.0* 10.5*  HCT 33.8* 31.8*  MCV 86.0 85.0  MCH 28.0 28.1  MCHC 32.5 33.0  RDW 14.0 14.1  PLT 177 185   Thyroid  No results for input(s): TSH, FREET4 in the last 168 hours.  BNP Recent Labs  Lab 08/04/24 1929  PROBNP 3,752.0*    DDimer No results for input(s): DDIMER in the last 168 hours.  Radiology/Studies:  [facility] Chest Port 1 View Result Date: 08/04/2024 IMPRESSION: 1.  Bilateral perihilar hazy opacities and diffuse interstitial opacities, likely edema. 2. Trace bilateral pleural effusions. 3. Cardiomegaly with post-CABG changes, aortic valve replacement, and vascular clips in the mediastinum. Electronically signed by: Greig Pique MD MD 08/04/2024 07:55 PM EST RP Workstation: [id number]     Assessment and Plan:  Acute on chronic CHF - Most recent echo 05/2020 at [facility] revealed EF 45 to 50% - Presenting with 3-day history of worsening shortness of breath - proBNP mildly elevated at 3752 with chest x-ray showing possible edema and trace bilateral pulmonary effusions - Received IV Lasix  60 mg x 1 in the ED - Volume status appears to be improving - Continue IV Lasix  40 mg twice daily - Continue to monitor kidney function, strict I/Os, and daily weights with ongoing diuresis - Recommend updating echo - She is continued on lisinopril  5 mg daily, metoprolol  tartrate 25 mg twice daily, and amlodipine  10 mg daily.  Can adjust/add GDMT pending echo results.  Acute hypoxic respiratory failure - In the setting of CHF exacerbation - Currently requiring 2 L/min by nasal cannula - Continue to wean O2 as able  Coronary artery disease Elevated troponin - Denies symptoms of angina - Troponin mildly elevated and overall flat trending - Suspect demand ischemia in the setting of CHF exacerbation - Update echo with further recommendations pending results - Continue Plavix  75 mg daily and rosuvastatin  20 mg daily  Hypokalemia - K 3.4 on presentation, now 3.5.  Continue to replete for K > 4.  Hypertension - BP reasonably well-controlled on current regimen of lisinopril , metoprolol , and amlodipine   Hyperlipidemia - LDL 48, at goal.  Continue rosuvastatin  as above.  COPD - Ongoing management per IM  Tobacco abuse - Smokes 1 to 2 cigarettes/day.  Complete cessation recommended.    For questions or updates, please contact [facility] Please consult  [email or web address] for contact info under      Signed, Lesley LITTIE Maffucci, PA-C  08/05/2024 8:20 AM     [1]  Allergies Allergen Reactions   Oxycodone  Itching   Codeine Itching and Rash   Hydrocodone Itching and Rash   Lipitor [Atorvastatin] Itching and Rash   "

## 2024-08-05 NOTE — Progress Notes (Addendum)
 " PROGRESS NOTE    Alicia Ortega  FMW:[id number] DOB: [birth year] DOA: 08/04/2024 PCP: Shona Norleen PEDLAR, MD  Chief Complaint  Patient presents with   Shortness of Breath    Hospital Course:  Alicia Ortega is a 74 y.o. female with medical history significant of ICM and sCHF with EF 40-50%, coronary artery disease (MI in 1987), s/p CABG, s/p of multiple PCI, PAF not on AC, T2DM, HLD, smoker, infrarenal AAA (s/p open repair 10/2017), depression with anxiety, COPD on 2.5 L of O2, who presents with SOB.  Patient admitted for acute on chronic CHF exacerbation. Hospital course as below  Subjective: Patient was examined at bedside, new to me today. Reports SOB significantly better, seen by cardiology Pending echo   Objective: Vitals:   08/05/24 0304 08/05/24 0500 08/05/24 0800 08/05/24 0903  BP: (!) 143/66   (!) 156/77  Pulse: 66   70  Resp: 20  (!) 25 15  Temp: 97.9 F (36.6 C)   97.6 F (36.4 C)  TempSrc:      SpO2: 96%   98%  Weight:  50.3 kg    Height:        Intake/Output Summary (Last 24 hours) at 08/05/2024 9077 Last data filed at 08/05/2024 0600 Gross per 24 hour  Intake 95.4 ml  Output 900 ml  Net -804.6 ml   Filed Weights   08/04/24 1925 08/04/24 2054 08/05/24 0500  Weight: 51.7 kg 63.7 kg 50.3 kg    Examination: General: Not in acute distress Heme: No neck lymph node enlargement. Cardiac: S1/S2, RRR, No gallops or rubs. Respiratory: Has fine crackles bilaterally, has decreased air movement bilaterally, no wheezing. GI: Soft, nondistended, nontender, no rebound pain, no organomegaly, BS present. Ext: No pitting leg edema bilaterally. 1+DP/PT pulse bilaterally. Musculoskeletal: No joint deformities, No joint redness or warmth, no limitation of ROM in spin. Skin: No rashes.  Neuro: Alert, oriented X3, cranial nerves II-XII grossly intact, moves all extremities normally. Psych: Patient is not psychotic, no suicidal or hemocidal ideation  Assessment & Plan:  Acute on  chronic systolic CHF Presented with elevated BNP, interstitial pulmonary edema on x-ray, crackles on auscultation. On IV Lasix  Pending echo Continue Jardiance  10 daily, increase lisinopril  to 20 mg, hold amlodipine  Change metoprolol  to tartrate to metoprolol  succinate Seen by cardiology appreciate recs  Transient hypoxia Resolved Check oxygen saturation with ambulation  CAD (coronary artery disease) s/p CABG Elevated troponin, 28 --> 50 --> 56,  EKG showed ST elevation in V1-V4 and left bundle blockade.she has chest tightness, but no active chest pain Pending echo If stable cardiac function, no plans for ischemic workup Cardiology following   Hx of PAF (paroxysmal atrial fibrillation) Patient is not taking anticoagulants Metoprolol  tartrate changed to metoprolol  succinate   HLD (hyperlipidemia) LDL 48, TG 265 Crestor  and fenofibrate    HTN (hypertension) Metoprolol , Inc lisinopril  Hold amlodipine    COPD (chronic obstructive pulmonary disease) Stable.  No wheezing Bronchodilators and as needed Mucinex    Diabetes mellitus without complication HbA1c 8.5, takes metformin 1000 mg BID and Toujeo  45-55 units daily  Lantus  13 units daily, SSI  Iron deficiency anemia Continue iron supplement   Hypokalemia Monitor and replete as needed   Smoker Cessation counseled Nicotine  patch, Chantix at discharge   Prolonged QT interval: QTc 526. Hold Cymbalta, Wellbutrin    Depression with anxiety Continue home Valium  Hold Wellbutrin  and Cymbalta due to QTc prolongation    DVT prophylaxis: Lovenox  SQ   Code Status: Full Code Disposition:  TBD  Consultants:  Treatment Team:  Consulting Physician: Perla Evalene PARAS, MD  Procedures:  None  Antimicrobials:  Anti-infectives (From admission, onward)    None       Data Reviewed: I have personally reviewed following labs and imaging studies CBC: Recent Labs  Lab 08/04/24 1929 08/05/24 0427  WBC 7.4 7.7  HGB 11.0*  10.5*  HCT 33.8* 31.8*  MCV 86.0 85.0  PLT 177 185   Basic Metabolic Panel: Recent Labs  Lab 08/04/24 1929 08/05/24 0427  NA 138 139  K 3.4* 3.5  CL 103 104  CO2 20* 22  GLUCOSE 417* 247*  BUN 17 17  CREATININE 0.90 0.95  CALCIUM  9.2 9.1  MG 1.7  --   PHOS 3.5  --    GFR: Estimated Creatinine Clearance: 40.5 mL/min (by C-G formula based on SCr of 0.95 mg/dL). Liver Function Tests: No results for input(s): AST, ALT, ALKPHOS, BILITOT, PROT, ALBUMIN in the last 168 hours. CBG: Recent Labs  Lab 08/04/24 2313 08/05/24 0904  GLUCAP 305* 185*    No results found for this or any previous visit (from the past 240 hours).   Radiology Studies: DG Chest Port 1 View Result Date: 08/04/2024 EXAM: 1 VIEW(S) XRAY OF THE CHEST 08/04/2024 07:44:00 PM COMPARISON: None available. CLINICAL HISTORY: Chest pain, shortness of breath. FINDINGS: LUNGS AND PLEURA: Bilateral perihilar hazy opacities and diffuse interstitial opacities, likely edema. Trace bilateral pleural effusions. No pneumothorax. HEART AND MEDIASTINUM: Cardiomegaly. Post-CABG changes. Aortic valve replacement. Vascular clips in mediastinum. Atherosclerotic aortic calcifications. BONES AND SOFT TISSUES: Intact sternotomy wires. No acute osseous abnormality. IMPRESSION: 1. Bilateral perihilar hazy opacities and diffuse interstitial opacities, likely edema. 2. Trace bilateral pleural effusions. 3. Cardiomegaly with post-CABG changes, aortic valve replacement, and vascular clips in the mediastinum. Electronically signed by: Greig Pique MD MD 08/04/2024 07:55 PM EST RP Workstation: [id number]    Scheduled Meds:  amLODipine   10 mg Oral Daily   vitamin D3  1,000 Units Oral Daily   clopidogrel   75 mg Oral Daily   enoxaparin  (LOVENOX ) injection  40 mg Subcutaneous Q24H   fenofibrate   160 mg Oral Daily   ferrous sulfate   325 mg Oral Q breakfast   furosemide   40 mg Intravenous Q12H   insulin  aspart  0-5 Units Subcutaneous QHS    insulin  aspart  0-9 Units Subcutaneous TID WC   lisinopril   5 mg Oral Daily   loratadine   10 mg Oral Daily   metoprolol  tartrate  25 mg Oral BID   multivitamin with minerals  1 tablet Oral Daily   nicotine   21 mg Transdermal Daily   rosuvastatin   20 mg Oral Daily   Continuous Infusions:   LOS: 1 day  MDM: Patient is high risk for one or more organ failure.  They necessitate ongoing hospitalization for continued IV therapies and subsequent lab monitoring. Total time spent interpreting labs and vitals, reviewing the medical record, coordinating care amongst consultants and care team members, directly assessing and discussing care with the patient and/or family: 55 min Laree Lock, MD [facility]  To contact the attending physician between 7A-7P please use Epic Chat. To contact the covering physician during after hours 7P-7A, please review Amion.  08/05/2024, 9:22 AM   *This document has been created with the assistance of dictation software. Please excuse typographical errors. *   "

## 2024-08-05 NOTE — Progress Notes (Signed)
 Patient is on RA at rest with O2 saturation at 95%.  She maintained 95% O2 saturation while walking around the unit.  She lives alone, walks unassisted and does all ADLs independently.

## 2024-08-05 NOTE — Progress Notes (Signed)
 Pt's xray from 08-04-24 Bilateral perihilar hazy opacities and diffuse interstitial opacities, likely edema. Trace bilateral pleural effusions.  Pt is in no respiratory distress 96% on 2L/[place]

## 2024-08-05 NOTE — Inpatient Diabetes Management (Signed)
 Inpatient Diabetes Program Recommendations  AACE/ADA: New Consensus Statement on Inpatient Glycemic Control  Target Ranges:  Prepandial:   less than 140 mg/dL      Peak postprandial:   less than 180 mg/dL (1-2 hours)      Critically ill patients:  140 - 180 mg/dL    Latest Reference Range & Units 08/05/24 09:04  Glucose-Capillary 70 - 99 mg/dL 814 (H)    Latest Reference Range & Units 08/04/24 23:13  Glucose-Capillary 70 - 99 mg/dL 694 (H)    Latest Reference Range & Units 08/04/24 19:29 08/05/24 04:27  Glucose 70 - 99 mg/dL 582 (H) 752 (H)    Latest Reference Range & Units 08/04/24 19:29  Hemoglobin A1C 4.8 - 5.6 % 8.5 (H)   Review of Glycemic Control  Diabetes history: DM2 Outpatient Diabetes medications: Jardiance  10 mg daily (not taking), Metformin 1000 mg BID, Toujeo  45-55 units daily Current orders for Inpatient glycemic control: Novolog  0-9 units TID with meals, Novolog  0-5 units QHS  Inpatient Diabetes Program Recommendations:    Insulin : Please consider ordering insulin  glargine 13 units Q24H (based on 50.3 kg x 0.25 units).  HbgA1C:  A1C 8.5% on 08/04/24 indicating an average glucose of 197 mg/dl over the past 2-3 months.  NOTE: Patient admitted with CHF. In reviewing chart, noted office visit note on 05/10/24 with Deward Abe, FNP from [facility] and DM medications listed as Metformin 1000 mg BID and Toujeo  50-70 units daily. Spoke with patient over the phone to inquire about DM medications. Patient states that she takes Metformin 1000 mg BID and Toujeo  45-55 units QAM (dose depends on glucose).  Patient states she is not taking Jardiance .  Patient reports that her glucose has been running higher over the past week or so and she states she does not skip or forget to take her Toujeo .  Patient reports that her last A1C was in the 8% range. Discussed current A1C of 8.5% on 08/04/24 indicating an average glucose of 197 mg/dl over the past 2-3 months. Discussed  initial glucose of 417 mg/dl on labs; patient reports her glucose is never that high at home. Informed patient that currently she is only ordered Novolog  correction insulin  and it would be requested that basal insulin  be added today. Patient verbalized understanding and has no questions at this time.  Thanks, Earnie Gainer, RN, MSN, CDCES Diabetes Coordinator Inpatient Diabetes Program [phone] (Team Pager from 8am to 5pm)

## 2024-08-05 NOTE — ED Notes (Signed)
 Called CCMD to add pt to monitoring.

## 2024-08-06 ENCOUNTER — Other Ambulatory Visit: Payer: Self-pay

## 2024-08-06 ENCOUNTER — Telehealth (HOSPITAL_COMMUNITY): Payer: Self-pay

## 2024-08-06 ENCOUNTER — Inpatient Hospital Stay
Admit: 2024-08-06 | Discharge: 2024-08-06 | Disposition: A | Attending: Cardiovascular Disease | Admitting: Cardiovascular Disease

## 2024-08-06 ENCOUNTER — Other Ambulatory Visit (HOSPITAL_COMMUNITY): Payer: Self-pay

## 2024-08-06 DIAGNOSIS — I5023 Acute on chronic systolic (congestive) heart failure: Secondary | ICD-10-CM

## 2024-08-06 LAB — BASIC METABOLIC PANEL WITH GFR
Anion gap: 15 (ref 5–15)
BUN: 22 mg/dL (ref 8–23)
CO2: 23 mmol/L (ref 22–32)
Calcium: 9.9 mg/dL (ref 8.9–10.3)
Chloride: 103 mmol/L (ref 98–111)
Creatinine, Ser: 0.95 mg/dL (ref 0.44–1.00)
GFR, Estimated: >60 mL/min
Glucose, Bld: 141 mg/dL — ABNORMAL HIGH (ref 70–99)
Potassium: 3.7 mmol/L (ref 3.5–5.1)
Sodium: 141 mmol/L (ref 135–145)

## 2024-08-06 LAB — ECHOCARDIOGRAM COMPLETE
Area-P 1/2: 2.93 cm2
Height: 62 in
S' Lateral: 4.2 cm
Weight: 1777.79 [oz_av]

## 2024-08-06 LAB — CBC
HCT: 37.2 % (ref 36.0–46.0)
Hemoglobin: 12.1 g/dL (ref 12.0–15.0)
MCH: 27.9 pg (ref 26.0–34.0)
MCHC: 32.5 g/dL (ref 30.0–36.0)
MCV: 85.7 fL (ref 80.0–100.0)
Platelets: 241 K/uL (ref 150–400)
RBC: 4.34 MIL/uL (ref 3.87–5.11)
RDW: 14.1 % (ref 11.5–15.5)
WBC: 9.1 K/uL (ref 4.0–10.5)
nRBC: 0 % (ref 0.0–0.2)

## 2024-08-06 LAB — GLUCOSE, CAPILLARY
Glucose-Capillary: 221 mg/dL — ABNORMAL HIGH (ref 70–99)
Glucose-Capillary: 182 mg/dL — ABNORMAL HIGH (ref 70–99)

## 2024-08-06 MED ORDER — PERFLUTREN LIPID MICROSPHERE
1.0000 mL | INTRAVENOUS | Status: AC | PRN
  Administered 2024-08-06: 3 mL via INTRAVENOUS

## 2024-08-06 MED ORDER — LISINOPRIL 20 MG PO TABS
20.0000 mg | ORAL_TABLET | Freq: Every day | ORAL | 1 refills | Status: AC
  Filled 2024-08-06: qty 30, 30d supply, fill #0

## 2024-08-06 MED ORDER — FUROSEMIDE 10 MG/ML IJ SOLN
20.0000 mg | Freq: Once | INTRAMUSCULAR | Status: AC
  Administered 2024-08-06: 20 mg via INTRAVENOUS
  Filled 2024-08-06: qty 2

## 2024-08-06 MED ORDER — EMPAGLIFLOZIN 10 MG PO TABS
10.0000 mg | ORAL_TABLET | Freq: Every day | ORAL | Status: DC
  Administered 2024-08-06: 10 mg via ORAL
  Filled 2024-08-06: qty 1

## 2024-08-06 MED ORDER — METOPROLOL SUCCINATE ER 50 MG PO TB24
50.0000 mg | ORAL_TABLET | Freq: Every evening | ORAL | 1 refills | Status: AC
  Filled 2024-08-06: qty 30, 30d supply, fill #0

## 2024-08-06 MED ORDER — FUROSEMIDE 20 MG PO TABS
20.0000 mg | ORAL_TABLET | Freq: Every day | ORAL | 1 refills | Status: AC
  Filled 2024-08-06: qty 30, 30d supply, fill #0

## 2024-08-06 MED ORDER — TOUJEO MAX SOLOSTAR 300 UNIT/ML ~~LOC~~ SOPN: 20.0000 [IU] | PEN_INJECTOR | Freq: Every morning | SUBCUTANEOUS | Status: DC

## 2024-08-06 MED ORDER — TOUJEO MAX SOLOSTAR 300 UNIT/ML ~~LOC~~ SOPN: 20.0000 [IU] | PEN_INJECTOR | Freq: Every morning | SUBCUTANEOUS | Status: AC

## 2024-08-06 NOTE — Discharge Summary (Signed)
 " Physician Discharge Summary   Patient: Alicia Ortega MRN: [id number] DOB: 06-02-1951  Admit date:     08/04/2024  Discharge date: 08/06/2024  Discharge Physician: Laree Lock   PCP: Shona Norleen PEDLAR, MD   Recommendations at discharge:   Follow-up with PCP within 5-7 days -repeat BMP Monitor Qtc -resume Cymbalta/Wellbutrin  as able Monitor CBG and adjust insulin   Follow-up with [facility] cardiology within 1 week -repeat BMP  Discharge Diagnoses: Principal Problem:   Acute on chronic systolic CHF (congestive heart failure) (HCC) Active Problems:   CAD (coronary artery disease)   Myocardial injury   PAF (paroxysmal atrial fibrillation) (HCC)   HLD (hyperlipidemia)   HTN (hypertension)   COPD (chronic obstructive pulmonary disease) (HCC)   Diabetes mellitus without complication (HCC)   Iron deficiency anemia   Hypokalemia   Smoker   Prolonged QT interval   Depression with anxiety   Acute pulmonary edema (HCC)   Ischemic cardiomyopathy  Hospital Course: Alicia Ortega is a 74 y.o. female with medical history significant of ICM and sCHF with EF 40-50%, coronary artery disease (MI in 1987), s/p CABG, s/p of multiple PCI, PAF not on AC, T2DM, HLD, smoker, infrarenal AAA (s/p open repair 10/2017), depression with anxiety, COPD who presents with SOB.  Patient admitted for acute on chronic CHF exacerbation. Hospital course as below  Daughter present at the bedside  Acute on chronic systolic CHF Presented with elevated BNP, interstitial pulmonary edema on x-ray, crackles on auscultation Echo shows EF 35 to 40%, moderately decreased compared to prior echo.  LV demonstrates RWMA, grade 2 DD.  Mild MR with moderate to severe mitral annular calcification S/p IV diuresis, on lasix  20mg  daily.  Seen by cardiology Continue Jardiance  10 daily, increase lisinopril  to 20 mg, discontinue amlodipine  Change metoprolol  to tartrate to metoprolol  succinate Patient would like to follow-up with [facility] cardiology  for further management, appointment next week   Transient hypoxia Resolved, saturating 95% with ambulation   CAD (coronary artery disease) s/p CABG Elevated troponin, 28 --> 50 --> 56,  EKG showed ST elevation in V1-V4 and left bundle blockade.she has chest tightness, but no active chest pain Echo as above Would like to follow-up with [facility] cardiology   Hx of PAF (paroxysmal atrial fibrillation) Patient is not taking anticoagulants Metoprolol  tartrate changed to metoprolol  succinate   HLD (hyperlipidemia) LDL 48, TG 265 Crestor  and fenofibrate    HTN (hypertension) Metoprolol , Inc lisinopril  Hold amlodipine    COPD (chronic obstructive pulmonary disease) Stable.  No wheezing   Diabetes mellitus without complication HbA1c 8.5, takes metformin 1000 mg BID and Toujeo  45-55 units daily  Toujeo  20 units, per requirements in the hospital Follow-up with PCP   Iron deficiency anemia Continue iron supplement   Hypokalemia -resolved Repleted   Smoker Cessation counseled Nicotine  patch   Prolonged QT interval: QTc 526. Hold Cymbalta, Wellbutrin  Follow-up outpatient   Depression with anxiety Continue home Valium  Hold Wellbutrin  and Cymbalta due to QTc prolongation     Pain control - [place]  Controlled Substance Reporting System database was reviewed. and patient was instructed, not to drive, operate heavy machinery, perform activities at heights, swimming or participation in water  activities or provide baby-sitting services while on Pain, Sleep and Anxiety Medications; until their outpatient Physician has advised to do so again. Also recommended to not to take more than prescribed Pain, Sleep and Anxiety Medications.  Consultants: Cardiology Procedures performed: None  Disposition: Home Diet recommendation: Heart healthy/carb modified  DISCHARGE MEDICATION: Allergies as of  08/06/2024       Reactions   Oxycodone  Itching   Codeine Itching, Rash   Hydrocodone Itching,  Rash   Lipitor [atorvastatin] Itching, Rash        Medication List     PAUSE taking these medications    buPROPion  150 MG 24 hr tablet Wait to take this until your doctor or other care provider tells you to start again. Commonly known as: WELLBUTRIN  XL Take 150 mg by mouth daily.   DULoxetine 30 MG capsule Wait to take this until your doctor or other care provider tells you to start again. Commonly known as: CYMBALTA Take 30 mg by mouth daily.       STOP taking these medications    amLODipine  10 MG tablet Commonly known as: NORVASC    metoprolol  tartrate 25 MG tablet Commonly known as: LOPRESSOR    oxyCODONE  5 MG immediate release tablet Commonly known as: Oxy IR/ROXICODONE        TAKE these medications    albuterol  108 (90 Base) MCG/ACT inhaler Commonly known as: VENTOLIN  HFA Inhale 1-2 puffs into the lungs every 6 (six) hours as needed for wheezing or shortness of breath.   benzonatate  100 MG capsule Commonly known as: TESSALON  Take 1 capsule (100 mg total) by mouth every 8 (eight) hours.   Biotin  2500 MCG Chew Chew 2,500 mcg by mouth daily.   cetirizine 10 MG tablet Commonly known as: ZYRTEC Take 10 mg by mouth daily as needed for allergies.   clopidogrel  75 MG tablet Commonly known as: PLAVIX  Take 75 mg by mouth daily.   colchicine  0.6 MG tablet Take 0.6 mg by mouth daily as needed (flare).   diazepam  2 MG tablet Commonly known as: VALIUM  Take 2 mg by mouth every 6 (six) hours as needed for anxiety.   fenofibrate  micronized 134 MG capsule Commonly known as: LOFIBRA Take 134 mg by mouth daily before breakfast.   ferrous sulfate  325 (65 FE) MG EC tablet Take 325 mg by mouth daily with breakfast.   furosemide  20 MG tablet Commonly known as: Lasix  Take 1 tablet (20 mg total) by mouth daily. Start taking on: August 07, 2024   gabapentin 300 MG capsule Commonly known as: NEURONTIN Take 300 mg by mouth 3 (three) times daily.   Jardiance  10  MG Tabs tablet Generic drug: empagliflozin  Take 10 mg by mouth daily.   lisinopril  20 MG tablet Commonly known as: ZESTRIL  Take 1 tablet (20 mg total) by mouth daily. Start taking on: August 07, 2024 What changed:  medication strength how much to take   LORazepam 0.5 MG tablet Commonly known as: ATIVAN Take 0.5 mg by mouth every 8 (eight) hours.   metFORMIN 1000 MG tablet Commonly known as: GLUCOPHAGE Take 1,000 mg by mouth 2 (two) times daily.   metoprolol  succinate 50 MG 24 hr tablet Commonly known as: TOPROL -XL Take 1 tablet (50 mg total) by mouth every evening. Take with or immediately following a meal.   multivitamin with minerals Tabs tablet Take 1 tablet by mouth daily.   nitroGLYCERIN  0.4 MG SL tablet Commonly known as: NITROSTAT  Place 0.4 mg under the tongue every 5 (five) minutes as needed for chest pain.   pantoprazole  40 MG tablet Commonly known as: PROTONIX  Take 1 tablet (40 mg total) by mouth 2 (two) times daily.   rosuvastatin  20 MG tablet Commonly known as: CRESTOR  Take 20 mg by mouth daily.   Toujeo  Max SoloStar 300 UNIT/ML Solostar Pen Generic drug: insulin  glargine (2 Unit  Dial) Inject 20 Units into the skin every morning. What changed: how much to take   vitamin D3 25 MCG tablet Commonly known as: CHOLECALCIFEROL  Take 1,000 Units by mouth daily.   zolpidem  5 MG tablet Commonly known as: AMBIEN  Take 5 mg by mouth daily.        Discharge Exam: Filed Weights   08/04/24 2054 08/05/24 0500 08/06/24 0500  Weight: 63.7 kg 50.3 kg 50.4 kg   General: Not in acute distress Cardiac: S1/S2, RRR, No gallops or rubs. Respiratory: Clear to auscultation bilaterally, no wheezes GI: Soft, nondistended, nontender, no rebound pain, no organomegaly, BS present. Ext: No pitting leg edema bilaterally. 1+DP/PT pulse bilaterally. Musculoskeletal: No joint deformities, No joint redness or warmth, no limitation of ROM in spin. Skin: No rashes.  Neuro:  Alert, oriented X3, no gross focal deficits  Condition at discharge: fair  The results of significant diagnostics from this hospitalization (including imaging, microbiology, ancillary and laboratory) are listed below for reference.   Imaging Studies: ECHOCARDIOGRAM COMPLETE Result Date: 08/06/2024    ECHOCARDIOGRAM REPORT   Patient Name:   GEORGEANNE FRANKLAND Date of Exam: 08/06/2024 Medical Rec #:  [id number]      Height:       62.0 in Accession #:    [id number]     Weight:       111.1 lb Date of Birth:  10-27-1950     BSA:          1.489 m Patient Age:    73 years       BP:           155/70 mmHg Patient Gender: F              HR:           69 bpm. Exam Location:  [facility] Procedure: 2D Echo, Color Doppler, Cardiac Doppler and Intracardiac            Opacification Agent (Both Spectral and Color Flow Doppler were            utilized during procedure). Indications:     CHF  History:         Patient has no prior history of Echocardiogram examinations.                  Cardiomyopathy, CAD, COPD; Risk Factors:Hypertension,                  Dyslipidemia, Diabetes and Current Smoker.  Sonographer:     Philomena Daring Referring Phys:  [id number] EVALENE JINNY LUNGER Diagnosing Phys: Caron Poser IMPRESSIONS  1. Left ventricular ejection fraction, by estimation, is 35 to 40%. The left ventricle has moderately decreased function. The left ventricle demonstrates regional wall motion abnormalities (see scoring diagram/findings for description). The left ventricular internal cavity size was mildly dilated. Left ventricular diastolic parameters are consistent with Grade II diastolic dysfunction (pseudonormalization).  2. Right ventricular systolic function is mildly reduced. The right ventricular size is normal.  3. Left atrial size was mildly dilated.  4. The mitral valve is degenerative. Mild mitral valve regurgitation. Mild calcific mitral stenosis. Moderate to severe mitral annular calcification.  5. The aortic valve has an indeterminant  number of cusps. There is moderate calcification of the aortic valve. Aortic valve regurgitation is trivial. Aortic valve sclerosis/calcification is present, without any evidence of aortic stenosis.  6. The inferior vena cava is normal in size with greater than 50% respiratory variability, suggesting right atrial pressure of 3  mmHg. Comparison(s): No prior Echocardiogram. FINDINGS  Left Ventricle: Left ventricular ejection fraction, by estimation, is 35 to 40%. The left ventricle has moderately decreased function. The left ventricle demonstrates regional wall motion abnormalities. Definity  contrast agent was given IV to delineate the left ventricular endocardial borders. The left ventricular internal cavity size was mildly dilated. There is no left ventricular hypertrophy. Abnormal (paradoxical) septal motion, consistent with left bundle branch block. Left ventricular diastolic parameters are consistent with Grade II diastolic dysfunction (pseudonormalization).  LV Wall Scoring: The mid inferoseptal segment is akinetic. The anterior septum, inferior wall, posterior wall, and basal inferoseptal segment are hypokinetic. Right Ventricle: The right ventricular size is normal. Right vetricular wall thickness was not well visualized. Right ventricular systolic function is mildly reduced. Left Atrium: Left atrial size was mildly dilated. Right Atrium: Right atrial size was normal in size. Pericardium: There is no evidence of pericardial effusion. Mitral Valve: The mitral valve is degenerative in appearance. Moderate to severe mitral annular calcification. Mild mitral valve regurgitation. Mild mitral valve stenosis. Tricuspid Valve: The tricuspid valve is normal in structure. Tricuspid valve regurgitation is mild . No evidence of tricuspid stenosis. Aortic Valve: The aortic valve has an indeterminant number of cusps. There is moderate calcification of the aortic valve. Aortic valve regurgitation is trivial. Aortic valve  sclerosis/calcification is present, without any evidence of aortic stenosis. Pulmonic Valve: The pulmonic valve was not well visualized. Pulmonic valve regurgitation is mild. No evidence of pulmonic stenosis. Aorta: The aortic root and ascending aorta are structurally normal, with no evidence of dilitation. Venous: The inferior vena cava is normal in size with greater than 50% respiratory variability, suggesting right atrial pressure of 3 mmHg. IAS/Shunts: The interatrial septum was not well visualized.  LEFT VENTRICLE PLAX 2D LVIDd:         5.50 cm   Diastology LVIDs:         4.20 cm   LV e' medial:    3.92 cm/s LV PW:         1.00 cm   LV E/e' medial:  34.4 LV IVS:        1.00 cm   LV e' lateral:   4.46 cm/s LVOT diam:     2.00 cm   LV E/e' lateral: 30.3 LV SV:         68 LV SV Index:   46 LVOT Area:     3.14 cm  RIGHT VENTRICLE            IVC RV S prime:     9.68 cm/s  IVC diam: 1.10 cm TAPSE (M-mode): 1.5 cm LEFT ATRIUM             Index        RIGHT ATRIUM          Index LA diam:        4.40 cm 2.95 cm/m   RA Area:     9.35 cm LA Vol (A2C):   58.7 ml 39.42 ml/m  RA Volume:   16.20 ml 10.88 ml/m LA Vol (A4C):   47.5 ml 31.90 ml/m LA Biplane Vol: 53.1 ml 35.66 ml/m  AORTIC VALVE LVOT Vmax:   118.00 cm/s LVOT Vmean:  75.200 cm/s LVOT VTI:    0.216 m  AORTA Ao Root diam: 2.30 cm MITRAL VALVE                TRICUSPID VALVE MV Area (PHT): 2.93 cm     TR Peak grad:  17.5 mmHg MV Decel Time: 259 msec     TR Vmax:        209.00 cm/s MV E velocity: 135.00 cm/s MV A velocity: 132.00 cm/s  SHUNTS MV E/A ratio:  1.02         Systemic VTI:  0.22 m                             Systemic Diam: 2.00 cm Caron Poser Electronically signed by Caron Poser Signature Date/Time: 08/06/2024/10:14:10 AM    Final    DG Chest Port 1 View Result Date: 08/04/2024 EXAM: 1 VIEW(S) XRAY OF THE CHEST 08/04/2024 07:44:00 PM COMPARISON: None available. CLINICAL HISTORY: Chest pain, shortness of breath. FINDINGS: LUNGS AND PLEURA:  Bilateral perihilar hazy opacities and diffuse interstitial opacities, likely edema. Trace bilateral pleural effusions. No pneumothorax. HEART AND MEDIASTINUM: Cardiomegaly. Post-CABG changes. Aortic valve replacement. Vascular clips in mediastinum. Atherosclerotic aortic calcifications. BONES AND SOFT TISSUES: Intact sternotomy wires. No acute osseous abnormality. IMPRESSION: 1. Bilateral perihilar hazy opacities and diffuse interstitial opacities, likely edema. 2. Trace bilateral pleural effusions. 3. Cardiomegaly with post-CABG changes, aortic valve replacement, and vascular clips in the mediastinum. Electronically signed by: Greig Pique MD MD 08/04/2024 07:55 PM EST RP Workstation: [id number]    Microbiology: Results for orders placed or performed during the hospital encounter of 05/18/21  Resp Panel by RT-PCR (Flu A&B, Covid) Nasopharyngeal Swab     Status: None   Collection Time: 05/18/21  3:13 PM   Specimen: Nasopharyngeal Swab; Nasopharyngeal(NP) swabs in vial transport medium  Result Value Ref Range Status   SARS Coronavirus 2 by RT PCR NEGATIVE NEGATIVE Final    Comment: (NOTE) SARS-CoV-2 target nucleic acids are NOT DETECTED.  The SARS-CoV-2 RNA is generally detectable in upper respiratory specimens during the acute phase of infection. The lowest concentration of SARS-CoV-2 viral copies this assay can detect is 138 copies/mL. A negative result does not preclude SARS-Cov-2 infection and should not be used as the sole basis for treatment or other patient management decisions. A negative result may occur with  improper specimen collection/handling, submission of specimen other than nasopharyngeal swab, presence of viral mutation(s) within the areas targeted by this assay, and inadequate number of viral copies(<138 copies/mL). A negative result must be combined with clinical observations, patient history, and epidemiological information. The expected result is Negative.  Fact Sheet  for Patients:  bloggercourse.com  Fact Sheet for Healthcare Providers:  seriousbroker.it  This test is no t yet approved or cleared by the [place]  FDA and  has been authorized for detection and/or diagnosis of SARS-CoV-2 by FDA under an Emergency Use Authorization (EUA). This EUA will remain  in effect (meaning this test can be used) for the duration of the COVID-19 declaration under Section 564(b)(1) of the Act, 21 U.S.C.section 360bbb-3(b)(1), unless the authorization is terminated  or revoked sooner.       Influenza A by PCR NEGATIVE NEGATIVE Final   Influenza B by PCR NEGATIVE NEGATIVE Final    Comment: (NOTE) The Xpert Xpress SARS-CoV-2/FLU/RSV plus assay is intended as an aid in the diagnosis of influenza from Nasopharyngeal swab specimens and should not be used as a sole basis for treatment. Nasal washings and aspirates are unacceptable for Xpert Xpress SARS-CoV-2/FLU/RSV testing.  Fact Sheet for Patients: bloggercourse.com  Fact Sheet for Healthcare Providers: seriousbroker.it  This test is not yet approved or cleared by the [place]  FDA and has been authorized  for detection and/or diagnosis of SARS-CoV-2 by FDA under an Emergency Use Authorization (EUA). This EUA will remain in effect (meaning this test can be used) for the duration of the COVID-19 declaration under Section 564(b)(1) of the Act, 21 U.S.C. section 360bbb-3(b)(1), unless the authorization is terminated or revoked.  Performed at [facility], [address] [place], [place] [address]     Labs: CBC: Recent Labs  Lab 08/04/24 1929 08/05/24 0427 08/06/24 0420  WBC 7.4 7.7 9.1  HGB 11.0* 10.5* 12.1  HCT 33.8* 31.8* 37.2  MCV 86.0 85.0 85.7  PLT 177 185 241   Basic Metabolic Panel: Recent Labs  Lab 08/04/24 1929 08/05/24 0427 08/05/24 0924 08/06/24 0420  NA 138 139  --  141  K  3.4* 3.5  --  3.7  CL 103 104  --  103  CO2 20* 22  --  23  GLUCOSE 417* 247*  --  141*  BUN 17 17  --  22  CREATININE 0.90 0.95  --  0.95  CALCIUM  9.2 9.1  --  9.9  MG 1.7  --  1.7  --   PHOS 3.5  --   --   --    Liver Function Tests: No results for input(s): AST, ALT, ALKPHOS, BILITOT, PROT, ALBUMIN in the last 168 hours. CBG: Recent Labs  Lab 08/05/24 1308 08/05/24 1624 08/05/24 2136 08/06/24 0744 08/06/24 1124  GLUCAP 161* 105* 233* 221* 182*    Discharge time spent: greater than 30 minutes.  Signed: Laree Lock, MD [facility] 08/06/2024 "

## 2024-08-06 NOTE — Telephone Encounter (Signed)
 Pharmacy Patient Advocate Encounter  Insurance verification completed.    The patient is insured through [employer]. Patient has Medicare and is not eligible for a copay card, but may be able to apply for patient assistance or Medicare RX Payment Plan (Patient Must reach out to their plan, if eligible for payment plan), if available.    Ran test claim for Generic Entresto 24-26mg  tablet and the current 30 day co-pay is $58.39.   This test claim was processed through [facility]- copay amounts may vary at other pharmacies due to pharmacy/plan contracts, or as the patient moves through the different stages of their insurance plan.

## 2024-08-06 NOTE — Plan of Care (Signed)

## 2024-08-06 NOTE — Progress Notes (Signed)
 "  Rounding Note   Patient Name: Alicia Ortega Date of Encounter: 08/06/2024  Cardiologist: [facility]  Subjective Patient is insistent on being discharged today. She reports improvement in breathing back to baseline, no longer requiring supplemental O2. She has been able to ambulate in the hallway without issue. Echo is pending.   Scheduled Meds:  vitamin D3  1,000 Units Oral Daily   clopidogrel   75 mg Oral Daily   enoxaparin  (LOVENOX ) injection  40 mg Subcutaneous Q24H   fenofibrate   160 mg Oral Daily   ferrous sulfate   325 mg Oral Q breakfast   furosemide   40 mg Intravenous Q12H   insulin  aspart  0-5 Units Subcutaneous QHS   insulin  aspart  0-9 Units Subcutaneous TID WC   insulin  glargine  13 Units Subcutaneous Daily   lisinopril   20 mg Oral Daily   loratadine   10 mg Oral Daily   metoprolol  succinate  50 mg Oral QPM   multivitamin with minerals  1 tablet Oral Daily   nicotine   21 mg Transdermal Daily   rosuvastatin   20 mg Oral Daily   Continuous Infusions:  PRN Meds: acetaminophen , colchicine , dextromethorphan -guaiFENesin , diazepam , diphenhydrAMINE , hydrALAZINE , ipratropium-albuterol , metoprolol  tartrate, nitroGLYCERIN , oxyCODONE -acetaminophen , zolpidem    Vital Signs  Vitals:   08/05/24 2349 08/06/24 0447 08/06/24 0500 08/06/24 0745  BP: 138/65 (!) 142/72  (!) 155/70  Pulse: 72 67  68  Resp: 18 18  16   Temp: 98.2 F (36.8 C) 98.2 F (36.8 C)  98.1 F (36.7 C)  TempSrc:    Oral  SpO2: 95% 94%  95%  Weight:   50.4 kg   Height:        Intake/Output Summary (Last 24 hours) at 08/06/2024 0801 Last data filed at 08/05/2024 1919 Gross per 24 hour  Intake 360 ml  Output --  Net 360 ml      08/06/2024    5:00 AM 08/05/2024    5:00 AM 08/04/2024    8:54 PM  Last 3 Weights  Weight (lbs) 111 lb 1.8 oz 110 lb 14.4 oz 140 lb 6.9 oz  Weight (kg) 50.4 kg 50.304 kg 63.7 kg      Telemetry Normal sinus with rare PACs/PVCs - Personally Reviewed  Physical Exam  GEN: No acute  distress.   Neck: No JVD Cardiac: RRR, no murmurs, rubs, or gallops.  Respiratory: Faint wheezing throughout GI: Soft, nontender, non-distended  MS: No edema; No deformity. Neuro:  Nonfocal  Psych: Normal affect   Labs High Sensitivity Troponin:  No results for input(s): TROPONINIHS in the last 720 hours.  Recent Labs  Lab 08/04/24 2126 08/04/24 2352 08/05/24 0427 08/05/24 0620 08/05/24 0924  TRNPT 50* 56* 80* 87* 81*       Chemistry Recent Labs  Lab 08/04/24 1929 08/05/24 0427 08/05/24 0924 08/06/24 0420  NA 138 139  --  141  K 3.4* 3.5  --  3.7  CL 103 104  --  103  CO2 20* 22  --  23  GLUCOSE 417* 247*  --  141*  BUN 17 17  --  22  CREATININE 0.90 0.95  --  0.95  CALCIUM  9.2 9.1  --  9.9  MG 1.7  --  1.7  --   GFRNONAA >60 >60  --  >60  ANIONGAP 15 13  --  15    Lipids  Recent Labs  Lab 08/05/24 0427  CHOL 129  TRIG 265*  HDL 28*  LDLCALC 48  CHOLHDL 4.6  Hematology Recent Labs  Lab 08/04/24 1929 08/05/24 0427 08/06/24 0420  WBC 7.4 7.7 9.1  RBC 3.93 3.74* 4.34  HGB 11.0* 10.5* 12.1  HCT 33.8* 31.8* 37.2  MCV 86.0 85.0 85.7  MCH 28.0 28.1 27.9  MCHC 32.5 33.0 32.5  RDW 14.0 14.1 14.1  PLT 177 185 241   Thyroid  No results for input(s): TSH, FREET4 in the last 168 hours.  BNP Recent Labs  Lab 08/04/24 1929  PROBNP 3,752.0*    DDimer No results for input(s): DDIMER in the last 168 hours.   Radiology  DG Chest Port 1 View Result Date: 08/04/2024 IMPRESSION: 1. Bilateral perihilar hazy opacities and diffuse interstitial opacities, likely edema. 2. Trace bilateral pleural effusions. 3. Cardiomegaly with post-CABG changes, aortic valve replacement, and vascular clips in the mediastinum. Electronically signed by: Greig Pique MD MD 08/04/2024 07:55 PM EST RP Workstation: [id number]    Cardiac Studies  07/2020 Cardiac cath [facility]) 1. 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 Coronary artery disease including 20%  mid-RCA stenosis, 90% proximal  lesion in OM1, 100% occlusion of mid-circumflex, 90% D1 stenosis and 95%  proximal LAD stenosis  3. Prior coronary bypass surgery with patent LIMA to LAD. The SVG to left  PDA is patent with a 50% distal stenosis. The SVG to OM2 is patent with a  70% mid graft stenosis  4. Successful PCI of SVG to lPDA with placement of a Xience drug eluting  stent  5. Successful PCI of SVG to OM2 with placement of a Xience drug eluting  stent    05/2020 2D echo [facility])  1. The left ventricle is normal in size with mildly increased wall  thickness.   2. The left ventricular systolic function is mildly decreased, LVEF is  visually estimated at 45-50%.    3. There is thinning and akinesis involving the apical septal and mid  anteroseptal segment(s).    4. Mitral valve repair with 28 mm Futura annuloplasty ring (2005).    5. The mitral valve leaflets are mildly thickened with mildly reduced  leaflet mobility.    6. There is mild to moderate mitral valve regurgitation.    7. There is mild mitral stenosis.    8. The aortic valve is trileaflet with moderately thickened leaflets with  probably normal excursion.    9. The left atrium is mildly dilated in size.    10. The right ventricle is normal in size, with normal systolic function.   Patient Profile   74 y.o. female with a hx of CAD s/p CABG and MVR in 2005 and multiple PCIs, ICM with EF 45-50% in 2021, T2DM, hypertlipidemia, tobacco abuse, infrarenal AAA s/p open repair in 2019, and COPD who was admitted 08/05/2023 for shortness of breath and who is being seen for the ongoing management of acute on chronic CHF.   Assessment & Plan   Acute on chronic CHF - Most recent echo 05/2020 at [facility] revealed EF 45 to 50% - Presenting with 3-day history of worsening shortness of breath - proBNP mildly elevated at 3752 with chest x-ray showing possible edema and trace bilateral pulmonary effusions - Does not appear grossly volume  overloaded - Will give 1 dose of IV Lasix  20 mg this morning - Echo pending - Continue lisinopril  20 mg daily and metoprolol  succinate 50 mg daily.  Will resume home Jardiance  10 mg daily. - Consider discharging with Lasix  20 mg to be taken as needed.  She has close outpatient  follow-up already scheduled with [facility] cardiology.  Acute hypoxic respiratory failure - In the setting of CHF exacerbation and longstanding COPD - Weaned off supplemental O2  Coronary artery disease Elevated troponin - Denies symptoms of angina - Troponin mildly elevated and overall flat trending - Suspect demand ischemia in the setting of CHF exacerbation - Echo ordered.  If LV systolic function is unchanged, would defer inpatient ischemic evaluation. - Continue Plavix  75 mg daily and rosuvastatin  20 mg daily  Hypokalemia - Improved to 3.7  Hypertension - BP reasonably well controlled  Hyperlipidemia - LDL 48, at goal.  Continue rosuvastatin  as above.   COPD - Ongoing management per IM  Tobacco abuse - Smokes 1 to 2 cigarettes/day.  Complete cessation recommended.    For questions or updates, please contact [facility] Please consult [email or web address] for contact info under       Signed, Lesley LITTIE Maffucci, PA-C  08/06/2024, 8:01 AM    "

## 2024-08-06 NOTE — Progress Notes (Addendum)
 Heart Failure Stewardship Pharmacy Note  PCP: Shona Norleen PEDLAR, MD PCP-Cardiologist: None  HPI: Alicia Ortega is a 74 y.o. female with ICM and sCHF with EF 40-50%, coronary artery disease (MI in 1987), s/p CABG, s/p of multiple PCI, PAF not on AC, T2DM, HLD, smoker, infrarenal AAA (s/p open repair 10/2017), depression with anxiety, COPD on 2.5 L of O2, who presented with SOB.   On admission, proBNP was 3752, HS-troponin was 28 and later 56, Scr 0.95, K 3.5, Mg 1.7, A1c 8.5%. Chest x-ray noted bilateral perihilar hazy opacities and diffuse interstitial opacities, likely edema. Trace bilateral pleural effusions.   EKG: Sinus rhythm, QTc 526, ST elevation in V1-V4, left bundle blockade, poor R wave progression, anteroseptal infarction pattern.   TTE 08/06/24 noted LVEF of 35-40%, G2DD, mild RV dysfunction, mild MS, mild MR.  Pertinent cardiac history: - MI in 1987, MVR 2005, s/p CABG, s/p of multiple PCI, PAF not on AC. 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 Prior CABG with patent LIMA to LAD. Successful PCI of SVG to IPDA and SCG to OM2.   Pertinent Lab Values: Creatinine, Ser  Date Value Ref Range Status  08/06/2024 0.95 0.44 - 1.00 mg/dL Final   BUN  Date Value Ref Range Status  08/06/2024 22 8 - 23 mg/dL Final   Potassium  Date Value Ref Range Status  08/06/2024 3.7 3.5 - 5.1 mmol/L Final   Sodium  Date Value Ref Range Status  08/06/2024 141 135 - 145 mmol/L Final   Magnesium   Date Value Ref Range Status  08/05/2024 1.7 1.7 - 2.4 mg/dL Final    Comment:    Performed at [facility], [address] [place], [place] [address]   Hgb A1c MFr Bld  Date Value Ref Range Status  08/04/2024 8.5 (H) 4.8 - 5.6 % Final    Comment:    (NOTE) Diagnosis of Diabetes The following HbA1c ranges recommended by the American Diabetes Association (ADA) may  be used as an aid in the diagnosis of diabetes mellitus.  Hemoglobin             Suggested A1C NGSP%              Diagnosis  <5.7                   Non Diabetic  5.7-6.4                Pre-Diabetic  >6.4                   Diabetic  <7.0                   Glycemic control for                       adults with diabetes.      Vital Signs: Temp:  [97.5 F (36.4 C)-98.2 F (36.8 C)] 98.1 F (36.7 C) (01/09 0745) Pulse Rate:  [66-74] 68 (01/09 0745) Cardiac Rhythm: Normal sinus rhythm;Bundle branch block;Heart block (01/08 2007) Resp:  [15-18] 16 (01/09 0745) BP: (132-156)/(63-79) 155/70 (01/09 0745) SpO2:  [92 %-98 %] 95 % (01/09 0745) Weight:  [50.4 kg (111 lb 1.8 oz)] 50.4 kg (111 lb 1.8 oz) (01/09 0500)  Intake/Output Summary (Last 24 hours) at 08/06/2024 0802 Last data filed at 08/05/2024  1919 Gross per 24 hour  Intake 360 ml  Output --  Net 360 ml    Current Heart Failure Medications:  Loop diuretic: furosemide  40 mg IV q12h Beta-Blocker: metoprolol  succinate 50 mg daily ACEI/ARB/ARNI: lisinopril  20 mg daily MRA: none SGLT2i: Jardiance  10 mg daily Other: none  Prior to admission Heart Failure Medications:  Loop diuretic: none Beta-Blocker: metoprolol  tartrate 25 mg BID  ACEI/ARB/ARNI: lisinopril  5 mg daily MRA: none SGLT2i: Jardiance  10 mg (not taking) Other: amlodipine  10 mg daily  Assessment: 1. Acute on chronic systolic heart failure (LVEF 35-40%) mild mitral valve regurgitation and mild mitral stenosis, due to ICM. NYHA class II-III symptoms.   08/05/24 received Mg 1 g IV at 01:21. Mg 1.7 at 09:24.  08/05/24 received potassium chloride  40 mEq PO once. Bumped K 3.7 > 3.5  TTE 08/06/24 results pending.   -Symptoms: Denies SOB, DOE, and orthopnea. Reports appetite is poor, but would be good at home.  -Volume: Appears visually euvolemic. No LEE. No JVD.  -Hemodynamics: BP 130-150s/60-70s, HR 60-70s. -BB: Agree with changing metoprolol  tartrate to metoprolol   succinate. -ACEI/ARB/ARNI: Lisinopril  increased from 5 mg daily to 20 mg daily. BP remains elevated. Can consider switching to Entresto 24/26 mg BID for more robust BP lowering. -MRA: Can consider adding spironolactone 12.5 mg daily. Scr 0.95, K 3.7. -SGLT2i: Agree with restarting home Jardiance  10 mg daily. A1c 8.7%. No symptoms of UTI.  Plan: 1) Medication changes recommended at this time: - Can consider adding spironolactone 12.5 mg daily today. - Can consider switching lisinopril  20 mg to Entresto 24/26 mg BID.  2) Patient assistance: - Generic Entresto copay is $58.39.   3) Education: - Patient has been educated on current HF medications and potential additions to HF medication regimen - Patient verbalizes understanding that over the next few months, these medication doses may change and more medications may be added to optimize HF regimen - Patient has been educated on basic disease state pathophysiology and goals of therapy  Calton Nash, FORTNEY.FRIES PharmD Candidate  Please do not hesitate to reach out with questions or concerns,  Jaun Bash, PharmD, CPP, BCPS, [facility] Heart Failure Pharmacist  Phone - [phone] 08/06/2024 8:02 AM

## 2024-08-09 ENCOUNTER — Telehealth: Payer: Self-pay

## 2024-08-09 NOTE — Transitions of Care (Post Inpatient/ED Visit) (Signed)
" ° °  08/09/2024  Name: Alicia Ortega MRN: [id number] DOB: [birth year]  Today's TOC FU Call Status: Today's TOC FU Call Status:: Unsuccessful Call (1st Attempt) Unsuccessful Call (1st Attempt) Date: 08/09/24  Attempted to reach the patient regarding the most recent Inpatient/ED visit.  Follow Up Plan: Additional outreach attempts will be made to reach the patient to complete the Transitions of Care (Post Inpatient/ED visit) call.   Lelar Farewell J. Ira Dougher RN, MSN [facility], [facility] RN Care Manager Direct Dial: [phone]  Fax: [phone] Website: [email or web address]   "

## 2024-08-10 ENCOUNTER — Telehealth: Payer: Self-pay

## 2024-08-10 NOTE — Patient Instructions (Signed)
 Visit Information  Thank you for taking time to visit with me today. Please don't hesitate to contact me if I can be of assistance to you   Report to your doctor weight gain of 2 -3 pounds in a day or 5 pounds in a week Please weight daily or as ordered by your doctor Limit salt intake Monitor for shortness of breath, swelling of feet, ankles or abdomen and weight gain. Never use the saltshaker.  Read all food labels and avoid canned, processed, and pickled foods.   Follow your doctor's recommendations for daily salt intake   Patient verbalizes understanding of instructions and care plan provided today and agrees to view in MyChart. Active MyChart status and patient understanding of how to access instructions and care plan via MyChart confirmed with patient.     The patient has been provided with contact information for the care management team and has been advised to call with any health related questions or concerns.   Please call the care guide team at [phone] if you need to cancel or reschedule your appointment.   Please call the Suicide and Crisis Lifeline: 988 if you are experiencing a Mental Health or Behavioral Health Crisis or need someone to talk to.  Mckennon Zwart J. Blakleigh Straw RN, MSN [facility], [facility] RN Care Manager Direct Dial: [phone]  Fax: [phone] Website: [email or web address]

## 2024-08-10 NOTE — Transitions of Care (Post Inpatient/ED Visit) (Signed)
 "  08/10/2024  Name: Alicia Ortega MRN: [id number] DOB: 01/23/1951  Today's TOC FU Call Status: Today's TOC FU Call Status:: Successful TOC FU Call Completed TOC FU Call Complete Date: 08/10/24  Patient's Name and Date of Birth confirmed. Name, DOB  Transition Care Management Follow-up Telephone Call Date of Discharge: 08/06/24 Discharge Facility: [facility]) Type of Discharge: Inpatient Admission Primary Inpatient Discharge Diagnosis:: Acute on chronic systolic CHF How have you been since you were released from the hospital?: Better Any questions or concerns?: No  Items Reviewed: Did you receive and understand the discharge instructions provided?: Yes Medications obtained,verified, and reconciled?: Yes (Medications Reviewed) Any new allergies since your discharge?: No Dietary orders reviewed?: Yes Type of Diet Ordered:: Low Sodium and carb modified Do you have support at home?: Yes People in Home [RPT]: alone Name of Support/Comfort Primary Source: Ron  Medications Reviewed Today: Medications Reviewed Today     Reviewed by Cailynn Bodnar, RN (Case Manager) on 08/10/24 at 1444  Med List Status: <None>   Medication Order Taking? Sig Documenting Provider Last Dose Status Informant  albuterol  (PROVENTIL  HFA;VENTOLIN  HFA) 108 (90 Base) MCG/ACT inhaler [id number] Yes Inhale 1-2 puffs into the lungs every 6 (six) hours as needed for wheezing or shortness of breath. [provider]  Active Self  benzonatate  (TESSALON ) 100 MG capsule [id number] Yes Take 1 capsule (100 mg total) by mouth every 8 (eight) hours.  Patient taking differently: Take 100 mg by mouth 3 (three) times daily as needed.   Gilmer Etta PARAS, NP  Active Self  Biotin  2500 MCG CHEW [id number]  Chew 2,500 mcg by mouth daily. [provider]  Active Self  buPROPion  (WELLBUTRIN  XL) 150 MG 24 hr tablet [id number]  Take 150 mg by mouth daily. [provider]  Active  Self  cetirizine (ZYRTEC) 10 MG tablet [id number] Yes Take 10 mg by mouth daily as needed for allergies.  Patient taking differently: Take 10 mg by mouth daily.   [provider]  Active Self  clopidogrel  (PLAVIX ) 75 MG tablet [id number] Yes Take 75 mg by mouth daily. [provider]  Active Self  colchicine  0.6 MG tablet [id number] Yes Take 0.6 mg by mouth daily as needed (flare). [provider]  Active Self  diazepam  (VALIUM ) 2 MG tablet [id number] Yes Take 2 mg by mouth every 6 (six) hours as needed for anxiety. [provider]  Active Self  DULoxetine (CYMBALTA) 30 MG capsule [id number]  Take 30 mg by mouth daily. [provider]  Active Self  fenofibrate  micronized (LOFIBRA) 134 MG capsule [id number] Yes Take 134 mg by mouth daily before breakfast. [provider]  Active Self  ferrous sulfate  325 (65 FE) MG EC tablet [id number] Yes Take 325 mg by mouth daily with breakfast. [provider]  Active Self  furosemide  (LASIX ) 20 MG tablet [id number] Yes Take 1 tablet (20 mg total) by mouth daily. Jerelene Critchley, MD  Active   gabapentin (NEURONTIN) 300 MG capsule [id number]  Take 300 mg by mouth 3 (three) times daily.  Patient not taking: Reported on 08/10/2024   [provider]  Active Self  insulin  glargine, 2 Unit Dial, (TOUJEO  MAX SOLOSTAR) 300 UNIT/ML Solostar Pen [id number] Yes Inject 20 Units into the skin every morning. Jerelene Critchley, MD  Active   JARDIANCE  10 MG TABS tablet [id number]  Take 10 mg by mouth daily.  Patient not taking: Reported on 08/10/2024   [provider]  Active Self  lisinopril  (ZESTRIL ) 20 MG tablet [id number] Yes Take 1 tablet (20 mg total) by mouth daily. Jerelene Critchley, MD  Active   LORazepam (ATIVAN) 0.5 MG tablet [id number] Yes Take 0.5 mg by mouth every 8 (eight) hours. [provider]  Active Self  metFORMIN (GLUCOPHAGE) 1000 MG tablet [id number] Yes Take 1,000 mg by mouth 2  (two) times daily. [provider]  Active Self  metoprolol  succinate (TOPROL -XL) 50 MG 24 hr tablet [id number] Yes Take 1 tablet (50 mg total) by mouth every evening. Take with or immediately following a meal. Ponnala, Shruthi, MD  Active   Multiple Vitamin (MULTIVITAMIN WITH MINERALS) TABS tablet [id number] Yes Take 1 tablet by mouth daily. [provider]  Active Self  nitroGLYCERIN  (NITROSTAT ) 0.4 MG SL tablet [id number] Yes Place 0.4 mg under the tongue every 5 (five) minutes as needed for chest pain. [provider]  Active Self  pantoprazole  (PROTONIX ) 40 MG tablet [id number]  Take 1 tablet (40 mg total) by mouth 2 (two) times daily.  Patient not taking: Reported on 08/10/2024   TatAlm, MD  Active Self  rosuvastatin  (CRESTOR ) 20 MG tablet [id number] Yes Take 20 mg by mouth daily. [provider]  Active Self  Vitamin D3 (VITAMIN D ) 25 MCG tablet [id number] Yes Take 1,000 Units by mouth daily. [provider]  Active Self  zolpidem  (AMBIEN ) 5 MG tablet [id number] Yes Take 5 mg by mouth daily. [provider]  Active Self            Home Care and Equipment/Supplies: Any new equipment or medical supplies ordered?: NA  Functional Questionnaire: Do you need assistance with bathing/showering or dressing?: No Do you need assistance with meal preparation?: No Do you need assistance with eating?: No Do you have difficulty maintaining continence: No Do you need assistance with getting out of bed/getting out of a chair/moving?: No Do you have difficulty managing or taking your medications?: No  Follow up appointments reviewed: PCP Follow-up appointment confirmed?: No (Patient states she will call Dr. Milford office) MD Provider Line [phone] Given: No Specialist Hospital Follow-up appointment confirmed?: Yes Date of Specialist follow-up appointment?: 08/09/24 Follow-Up Specialty Provider:: Cassandra Ramm Do you need  transportation to your follow-up appointment?: No  SDOH Interventions Today    Flowsheet Row Most Recent Value  SDOH Interventions   Food Insecurity Interventions Intervention Not Indicated  Housing Interventions Intervention Not Indicated  Transportation Interventions Intervention Not Indicated  Utilities Interventions Intervention Not Indicated    Cinnamon Morency J. Dallon Dacosta RN, MSN [facility]  [facility], [facility] RN Care Manager Direct Dial: [phone]  Fax: [phone] Website: [email or web address]   "

## 2024-08-16 ENCOUNTER — Other Ambulatory Visit: Payer: Self-pay
